# Patient Record
Sex: Female | Born: 1935 | Race: White | Hispanic: No | Smoking: Former smoker
Health system: Southern US, Community
[De-identification: ages and names within clinical notes are randomized; demographics above are authoritative.]

## PROBLEM LIST (undated history)

## (undated) DIAGNOSIS — I341 Nonrheumatic mitral (valve) prolapse: Secondary | ICD-10-CM

## (undated) DIAGNOSIS — R011 Cardiac murmur, unspecified: Secondary | ICD-10-CM

## (undated) DIAGNOSIS — O88819 Other embolism in pregnancy, unspecified trimester: Secondary | ICD-10-CM

## (undated) DIAGNOSIS — M1990 Unspecified osteoarthritis, unspecified site: Secondary | ICD-10-CM

## (undated) DIAGNOSIS — O88219 Thromboembolism in pregnancy, unspecified trimester: Secondary | ICD-10-CM

## (undated) DIAGNOSIS — I10 Essential (primary) hypertension: Secondary | ICD-10-CM

## (undated) DIAGNOSIS — E7800 Pure hypercholesterolemia, unspecified: Secondary | ICD-10-CM

## (undated) HISTORY — PX: CATARACT EXTRACTION: SUR2

## (undated) HISTORY — PX: BREAST SURGERY: SHX581

## (undated) HISTORY — PX: EYE SURGERY: SHX253

## (undated) HISTORY — PX: FOOT SURGERY: SHX648

---

## 1998-09-02 ENCOUNTER — Emergency Department (HOSPITAL_COMMUNITY): Admission: EM | Admit: 1998-09-02 | Discharge: 1998-09-02 | Payer: Self-pay | Admitting: Emergency Medicine

## 1998-09-02 ENCOUNTER — Encounter: Payer: Self-pay | Admitting: Emergency Medicine

## 2000-01-08 ENCOUNTER — Ambulatory Visit (HOSPITAL_COMMUNITY): Admission: RE | Admit: 2000-01-08 | Discharge: 2000-01-08 | Payer: Self-pay | Admitting: Internal Medicine

## 2000-03-12 ENCOUNTER — Encounter: Admission: RE | Admit: 2000-03-12 | Discharge: 2000-03-12 | Payer: Self-pay | Admitting: Endocrinology

## 2000-03-12 ENCOUNTER — Encounter: Payer: Self-pay | Admitting: Internal Medicine

## 2000-03-26 ENCOUNTER — Other Ambulatory Visit: Admission: RE | Admit: 2000-03-26 | Discharge: 2000-03-26 | Payer: Self-pay | Admitting: Internal Medicine

## 2001-05-26 ENCOUNTER — Encounter: Payer: Self-pay | Admitting: Internal Medicine

## 2001-05-26 ENCOUNTER — Encounter: Admission: RE | Admit: 2001-05-26 | Discharge: 2001-05-26 | Payer: Self-pay | Admitting: Internal Medicine

## 2001-07-01 ENCOUNTER — Other Ambulatory Visit: Admission: RE | Admit: 2001-07-01 | Discharge: 2001-07-01 | Payer: Self-pay | Admitting: Internal Medicine

## 2002-06-29 ENCOUNTER — Encounter: Payer: Self-pay | Admitting: Internal Medicine

## 2002-06-29 ENCOUNTER — Encounter: Admission: RE | Admit: 2002-06-29 | Discharge: 2002-06-29 | Payer: Self-pay | Admitting: Internal Medicine

## 2002-12-15 ENCOUNTER — Other Ambulatory Visit: Admission: RE | Admit: 2002-12-15 | Discharge: 2002-12-15 | Payer: Self-pay | Admitting: Internal Medicine

## 2005-10-29 ENCOUNTER — Ambulatory Visit (HOSPITAL_COMMUNITY): Admission: RE | Admit: 2005-10-29 | Discharge: 2005-10-29 | Payer: Self-pay | Admitting: *Deleted

## 2006-08-28 ENCOUNTER — Inpatient Hospital Stay (HOSPITAL_COMMUNITY): Admission: EM | Admit: 2006-08-28 | Discharge: 2006-08-29 | Payer: Self-pay | Admitting: Emergency Medicine

## 2010-03-22 ENCOUNTER — Encounter: Admission: RE | Admit: 2010-03-22 | Discharge: 2010-03-22 | Payer: Self-pay | Admitting: Internal Medicine

## 2010-05-25 ENCOUNTER — Emergency Department (HOSPITAL_BASED_OUTPATIENT_CLINIC_OR_DEPARTMENT_OTHER)
Admission: EM | Admit: 2010-05-25 | Discharge: 2010-05-25 | Payer: Self-pay | Source: Home / Self Care | Admitting: Emergency Medicine

## 2010-05-25 ENCOUNTER — Ambulatory Visit: Payer: Self-pay | Admitting: Interventional Radiology

## 2010-08-19 ENCOUNTER — Encounter
Admission: RE | Admit: 2010-08-19 | Discharge: 2010-08-19 | Payer: Self-pay | Source: Home / Self Care | Attending: Internal Medicine | Admitting: Internal Medicine

## 2010-10-10 ENCOUNTER — Emergency Department (HOSPITAL_BASED_OUTPATIENT_CLINIC_OR_DEPARTMENT_OTHER): Payer: Medicare Other

## 2010-10-10 ENCOUNTER — Emergency Department (HOSPITAL_BASED_OUTPATIENT_CLINIC_OR_DEPARTMENT_OTHER)
Admission: EM | Admit: 2010-10-10 | Discharge: 2010-10-10 | Disposition: A | Discharge status: Home / Self Care | Payer: Medicare Other | Attending: Emergency Medicine | Admitting: Emergency Medicine

## 2010-10-10 DIAGNOSIS — Z79899 Other long term (current) drug therapy: Secondary | ICD-10-CM | POA: Insufficient documentation

## 2010-10-10 DIAGNOSIS — S93609A Unspecified sprain of unspecified foot, initial encounter: Secondary | ICD-10-CM | POA: Insufficient documentation

## 2010-10-10 DIAGNOSIS — W1809XA Striking against other object with subsequent fall, initial encounter: Secondary | ICD-10-CM | POA: Insufficient documentation

## 2010-10-10 DIAGNOSIS — Y92009 Unspecified place in unspecified non-institutional (private) residence as the place of occurrence of the external cause: Secondary | ICD-10-CM | POA: Insufficient documentation

## 2010-10-10 DIAGNOSIS — I10 Essential (primary) hypertension: Secondary | ICD-10-CM | POA: Insufficient documentation

## 2010-10-10 DIAGNOSIS — M7989 Other specified soft tissue disorders: Secondary | ICD-10-CM

## 2010-10-10 DIAGNOSIS — Y93E1 Activity, personal bathing and showering: Secondary | ICD-10-CM

## 2010-10-10 DIAGNOSIS — W010XXA Fall on same level from slipping, tripping and stumbling without subsequent striking against object, initial encounter: Secondary | ICD-10-CM

## 2010-10-10 DIAGNOSIS — M79609 Pain in unspecified limb: Secondary | ICD-10-CM

## 2010-11-21 LAB — CBC
HCT: 35.9 % — ABNORMAL LOW (ref 36.0–46.0)
Hemoglobin: 12.6 g/dL (ref 12.0–15.0)
MCV: 90.4 fL (ref 78.0–100.0)
Platelets: 192 10*3/uL (ref 150–400)
RBC: 3.97 MIL/uL (ref 3.87–5.11)
WBC: 5.4 10*3/uL (ref 4.0–10.5)

## 2010-11-21 LAB — DIFFERENTIAL
Eosinophils Relative: 2 % (ref 0–5)
Lymphocytes Relative: 23 % (ref 12–46)
Lymphs Abs: 1.2 10*3/uL (ref 0.7–4.0)
Monocytes Absolute: 0.6 10*3/uL (ref 0.1–1.0)

## 2010-11-21 LAB — BASIC METABOLIC PANEL
BUN: 15 mg/dL (ref 6–23)
CO2: 23 mEq/L (ref 19–32)
Calcium: 9 mg/dL (ref 8.4–10.5)
Chloride: 108 mEq/L (ref 96–112)
Creatinine, Ser: 0.7 mg/dL (ref 0.4–1.2)
GFR calc Af Amer: >60 mL/min (ref >60)
GFR calc non Af Amer: >60 mL/min (ref >60)
Glucose, Bld: 102 mg/dL — ABNORMAL HIGH (ref 70–99)
Potassium: 4.2 mEq/L (ref 3.5–5.1)
Sodium: 140 mEq/L (ref 135–145)

## 2010-11-21 LAB — POCT CARDIAC MARKERS
CKMB, poc: <1 ng/mL — ABNORMAL LOW (ref 1.0–8.0)
Troponin i, poc: <0.05 ng/mL (ref 0.00–0.09)

## 2010-11-21 LAB — URINALYSIS, ROUTINE W REFLEX MICROSCOPIC
Glucose, UA: NEGATIVE mg/dL
Ketones, ur: NEGATIVE mg/dL
pH: 7.5 (ref 5.0–8.0)

## 2011-01-24 NOTE — Op Note (Signed)
Faith Ritter, WOLDT NO.:  [id number]   MEDICAL RECORD NO.:  [id number]          PATIENT TYPE:  AMB   LOCATION:  ENDO                         FACILITY:  [facility]   PHYSICIAN:  Georgiana Spinner, M.D.    DATE OF BIRTH:  1935/12/17   DATE OF PROCEDURE:  10/29/2005  DATE OF DISCHARGE:                                 OPERATIVE REPORT   PROCEDURE:  Upper endoscopy.   INDICATION:  Abdominal pain.   ANESTHESIA:  Demerol 30 Versed 5 mg.   DESCRIPTION OF PROCEDURE:  With the patient mildly sedated in the left  lateral decubitus position, the Olympus videoscopic endoscope was inserted  in the mouth and passed under direct vision through the esophagus which  appeared normal. There was no evidence of Barrett's. We entered into the  stomach, fundus, body, antrum, duodenal bulb, second portion of the duodenum  appeared normal.   From this point the endoscope was slowly withdrawn taking circumferential  views of duodenal mucosa until the endoscope then pulled back into the  stomach, placed in retroflexion to view the stomach from below. The  endoscope was straightened and withdrawn taking circumferential views of the  remaining gastric and esophageal mucosa. The patient's vital signs and pulse  oximeter remained stable. The patient tolerated the procedure well without  apparent complications.   FINDINGS:  Unremarkable examination.   PLAN:  Proceed to colonoscopy.           ______________________________  Georgiana Spinner, M.D.     GMO/MEDQ  D:  10/29/2005  T:  10/29/2005  Job:  [id number]

## 2011-01-24 NOTE — Op Note (Signed)
[name], [name] NO.:  [id number]   MEDICAL RECORD NO.:  [id number]          PATIENT TYPE:  AMB   LOCATION:  ENDO                         FACILITY:  [facility]   PHYSICIAN:  Georgiana Spinner, M.D.    DATE OF BIRTH:  1936-05-01   DATE OF PROCEDURE:  10/29/2005  DATE OF DISCHARGE:                                 OPERATIVE REPORT   PROCEDURE:  Colonoscopy   INDICATIONS:  Abdominal pain and colon cancer screening.   ANESTHESIA:  Demerol 20 Versed to 2.5 mg.   DESCRIPTION OF PROCEDURE:  With the patient mildly sedated in the left  lateral decubitus position, the Olympus videoscopic colonoscope was inserted  in the rectum and passed with pressure applied to the abdomen. Under direct  vision we reached the cecum identified by ileocecal valve and appendiceal  orifice both of which were photographed. From this point the colonoscope was  slowly withdrawn taking circumferential views of the colonic mucosa stopping  in the rectum which appeared normal on direct and showed hemorrhoids in  retroflex view.  The endoscope was straightened and withdrawn. The patient's  vital signs and pulse oximeter remained stable. The patient tolerated the  procedure well without apparent complications.   FINDINGS:  Internal hemorrhoids, otherwise unremarkable colonoscopic  examination to the cecum.   PLAN:  To have the patient follow up with me as an outpatient.           ______________________________  Georgiana Spinner, M.D.     GMO/MEDQ  D:  10/29/2005  T:  10/29/2005  Job:  [id number]

## 2011-01-24 NOTE — H&P (Signed)
NAMEJAELANI, POSA NO.:  [id number]   MEDICAL RECORD NO.:  [id number]          PATIENT TYPE:  EMS   LOCATION:  MAJO                         FACILITY:  [facility]   PHYSICIAN:  Elliot Cousin, M.D.    DATE OF BIRTH:  28-Aug-1936   DATE OF ADMISSION:  08/28/2006  DATE OF DISCHARGE:                              HISTORY & PHYSICAL   PRIMARY CARE PHYSICIAN:  Dr. Dimas Alexandria.   CHIEF COMPLAINT:  Chest pain.   HISTORY OF PRESENT ILLNESS:  The patient is a 75 year old woman with a  past medical history significant for hypertension and hyperlipidemia,  who presents to the emergency department with a 2-day history of  intermittent chest pain.  The chest pain is located in the substernal  area and occasionally in the epigastric abdominal region.  The pain is  described as a pressure.  At its worst, it is a 6-8/10 in intensity.  It occurs primarily at rest and only occasionally with activity.  On one  occasion earlier yesterday, the pain was associated with diaphoresis and  nausea.  She also had lightheadedness, but no vertigo.  She denies any  associated shortness of breath, pleurisy, cough, burping, indigestion  and fever and chills.  The pain occasionally radiates to the left arm,  primarily over the left shoulder; however, the patient states that she  has intermittent chronic left shoulder pain.  The patient usually  exercises twice weekly at a local gym.  Over the past few weeks, she has  had no pain during her workouts.  She also states that she had a stress  test performed a few years ago and it was negative.   The patient presented to the [facility] earlier today, prior to  presenting to the emergency department at [facility].  At that  time, her EKG revealed sinus bradycardia with a first-degree AV block  and a heart rate of 55 beats per minute.  When the patient presented to  the emergency department at [facility], she was noted to be mildly  hypertensive with a heart rate of 181/87.  She was given 4 baby aspirin,  but no sublingual nitroglycerin.  She is currently chest-pain-free,  although she says that the pain feels as if it is beginning to come  back.  The patient will therefore be admitted for further evaluation and  management.   PAST MEDICAL HISTORY:  1. History of a negative stress test in the past.  2. Hypertension.  3. Hyperlipidemia.  4. History of hospitalization in 1991 secondary to malnutrition      associated with depression.  5. Degenerative joint disease with chronic intermittent left shoulder      pain.   MEDICATIONS:  The patient is unclear on the doses of her medications;  however, she takes:  1. Atenolol one tablet daily.  2. Hyzaar one tablet daily.  3. TriCor one tablet daily.  4. Aspirin 81 mg daily.   ALLERGIES:  The patient has an allergy to SULFA DRUGS and PENICILLIN,  which causes swelling.   SOCIAL HISTORY:  The patient is divorced.  [place]  She has 5 children.  She is a retired [employer] trader.  She  denies tobacco use.  She denies illicit drug use.  She rarely drinks  alcohol.  She is fairly independent and goes to the gym twice weekly.   FAMILY HISTORY:  Her mother died of a stroke at 60 years of age.  Her  father died of a heart attack at 17 years of age.   REVIEW OF SYSTEMS:  The patient's review of systems is positive for  arthritic pain.   EXAM:  VITAL SIGNS:  Temperature 99.2, blood pressure 181/87, pulse 74,  respiratory rate 20, oxygen saturation 99% on room air.  GENERAL:  The patient is a pleasant 75 year old Caucasian woman who is  currently lying in bed in no acute distress.  HEENT:  Head is normocephalic and nontraumatic.  Pupils are equal, round  and reactive to light.  Extraocular movements are intact.  Conjunctivae  are clear.  Sclerae are white.  Tympanic membranes are clear  bilaterally.  Nasal mucosa is mildly dry, no sinus  tenderness.  Oropharynx reveals partial dentures.  Mucous membranes are minimally  dry.  No posterior exudates or erythema.  NECK:  Supple with no adenopathy, no thyromegaly, no bruit, no JVD.  LUNGS:  Clear to auscultation bilaterally.  HEART:  S1 and S2 with a soft systolic murmur.  ABDOMEN:  Positive bowel sounds.  Soft, nontender and non-distended.  No  hepatosplenomegaly.  No masses palpated.  EXTREMITIES:  Pedal pulses are 2+ bilaterally.  No pretibial edema and  no pedal edema.  Mild arthritic changes seen in her knees and her hands.  The patient has a good range of motion of her upper and lower  extremities.  NEUROLOGIC:  The patient is alert and oriented x3.  Cranial nerves II-  XII are intact.  Strength is 5/5 throughout.  Sensation is intact.   ADMISSION LABORATORY DATA:  EKG reveals normal sinus rhythm with a heart  rate of 61 beats per minute and a first-degree AV block.   Sodium 137, potassium 3.8, chloride 105, BUN 18, glucose 118,  bicarbonate 28, creatinine 1.0.  Myoglobin 75.3, CK-MB 1.6, troponin I  less than 0.05.  WBC 7.0, hemoglobin 13, hematocrit 38.2, platelets  230,000.  Total protein 6.6, albumin 3.8, AST 24, ALT 22, alkaline  phosphatase 57, total bilirubin 0.4, lipase 44.   RADIOLOGIC FINDINGS:  Chest x-ray reveals no acute disease.   ASSESSMENT:  1. Substernal chest pain.  The patient's presentation is somewhat      atypical for angina, although 2 components, namely diaphoresis and      nausea, are not.  The patient's pain occurs primarily with rest and      not with activity.  However, the patient does have significant risk      factors for coronary artery disease including positive family      history, hypertension, and hyperlipidemia.  2. Hypertension.  The patient is chronically treated with atenolol and      Hyzaar.  3. Hyperlipidemia.  The patient is chronically treated with TriCor.   PLAN: 1. The patient will be admitted for further evaluation  and management.  2. We will start full-dose Lovenox, nitroglycerin paste, aspirin,      oxygen therapy and p.r.n. morphine.  We will continue atenolol and      Hyzaar.  3. We will check cardiac enzymes q.8 h. x3.  We will also assess the  patient's TSH, fasting lipid panel and homocysteine level.  4. Consider inpatient versus outpatient stress test.  It may be      reasonable to defer stress testing to the outpatient setting, if      the patient's cardiac enzymes are completely negative.  5. We will repeat an EKG in the morning and p.r.n. for chest pain.      Elliot Cousin, M.D.  Electronically Signed     DF/MEDQ  D:  08/28/2006  T:  08/28/2006  Job:  [id number]   cc:   Janae Bridgeman. Eloise Harman., M.D.

## 2011-01-24 NOTE — Discharge Summary (Signed)
Faith Ritter, WIRKKALA NO.:  [id number]   MEDICAL RECORD NO.:  [id number]          PATIENT TYPE:  INP   LOCATION:  4733                         FACILITY:  [facility]   PHYSICIAN:  Madaline Savage, MD        DATE OF BIRTH:  Oct 21, 1935   DATE OF ADMISSION:  08/27/2006  DATE OF DISCHARGE:                               DISCHARGE SUMMARY   PRIMARY CARE PHYSICIAN:  Janae Bridgeman. Lendell Caprice, M.D.   DISCHARGE DIAGNOSES:  1. Atypical chest pain, MI ruled out.  2. Hypertension.  3. Hyperlipidemia.   PROCEDURES DONE IN THE HOSPITAL:  She had an x-ray of the chest done on  August 27, 2006 which showed cardiomegaly without any acute disease.   HISTORY OF PRESENT ILLNESS:  Mrs. Placzek is a 75 year old woman with a  history of hypertension and hyperlipidemia who came to the ER with a two  day history of intermittent chest pain.  Chest pain was substernal.  She  also had some shoulder problems and has had shoulder pain for awhile.  She does not think the chest pain made the shoulder pain any worse.  She  has had multiple episodes of this pain and it has no relation to  exertion.  Her pain also did not improve with the nitroglycerin she was  given in the ER.  She was admitted to the hospital for further  evaluation of chest pain.   PROBLEM LIST:  1. Atypical chest pain.  Her chest pain had more atypical features,      but she did have risk factors for hypertension and hyperlipidemia.      We completed her cardiac enzymes while in the hospital, which were      all negative.  Her troponins were 0.02, 0.02 and 0.01.  Her EKG did      not show any ST or T-wave changes.  She has been chest pain free      since she came to the hospital.  Considering that she has two risk      factors, she will need a stress test to rule out coronary artery      disease.  I offered to have this done as an inpatient versus      getting it done as an outpatient by her primary care physician.      She would  prefer to get it done as an outpatient and so she is      being discharged home in stable condition.  On discharge, she will      be given sublingual nitroglycerin prescriptions, which she will      take if she develops any chest pain x3 three minutes apart.  If she      still continues to have pain after those three doses of sublingual      nitroglycerin, she is advised to call 911 and come to the ER.  She      will also continue on the aspirin and beta blocker, which she was      on at home.  2. Hypertension.  Her blood pressure  was well controlled on this      hospital stay on her home dose of medications.  3. Hyperlipidemia.  We did get the cholesterol panel while she was in      the hospital, which showed triglycerides of 122, total cholesterol      of 161, HDL of 44 and LDL of 93.  She will continue on the Tricor      she was on at home.   DISCHARGE MEDICATIONS:  1. Nitroglycerin 0.4 mg sublingual for chest pain every three minutes      x3.  2. Atenolol 25 mg once daily.  3. Hizaar 100 per 25 once daily.  4. Aspirin 81 mg once daily.  5. Tricor 40 mg once daily.   DISPOSITION:  She is now being discharged home in stable condition.   FOLLOW UP:  She has been advised to call Dr. Pincus Sanes office early  next week to arrange for a stress test as an outpatient within a week.      Madaline Savage, MD  Electronically Signed     PKN/MEDQ  D:  08/29/2006  T:  08/30/2006  Job:  [id number]   cc:   Janae Bridgeman. Eloise Harman., M.D.

## 2011-05-25 ENCOUNTER — Encounter: Payer: Self-pay | Admitting: Emergency Medicine

## 2011-05-25 ENCOUNTER — Emergency Department (INDEPENDENT_AMBULATORY_CARE_PROVIDER_SITE_OTHER): Payer: Medicare Other

## 2011-05-25 ENCOUNTER — Emergency Department (HOSPITAL_BASED_OUTPATIENT_CLINIC_OR_DEPARTMENT_OTHER)
Admission: EM | Admit: 2011-05-25 | Discharge: 2011-05-25 | Disposition: A | Discharge status: Home / Self Care | Payer: Medicare Other | Attending: Emergency Medicine | Admitting: Emergency Medicine

## 2011-05-25 ENCOUNTER — Other Ambulatory Visit: Payer: Self-pay

## 2011-05-25 DIAGNOSIS — R079 Chest pain, unspecified: Secondary | ICD-10-CM

## 2011-05-25 DIAGNOSIS — I10 Essential (primary) hypertension: Secondary | ICD-10-CM | POA: Insufficient documentation

## 2011-05-25 DIAGNOSIS — E7800 Pure hypercholesterolemia, unspecified: Secondary | ICD-10-CM | POA: Insufficient documentation

## 2011-05-25 DIAGNOSIS — R0602 Shortness of breath: Secondary | ICD-10-CM

## 2011-05-25 HISTORY — DX: Pure hypercholesterolemia, unspecified: E78.00

## 2011-05-25 HISTORY — DX: Essential (primary) hypertension: I10

## 2011-05-25 LAB — BASIC METABOLIC PANEL
GFR calc Af Amer: >60 mL/min (ref >60)
GFR calc non Af Amer: >60 mL/min (ref >60)
Glucose, Bld: 125 mg/dL — ABNORMAL HIGH (ref 70–99)
Potassium: 4.2 mEq/L (ref 3.5–5.1)
Sodium: 138 mEq/L (ref 135–145)

## 2011-05-25 LAB — DIFFERENTIAL
Lymphocytes Relative: 22 % (ref 12–46)
Lymphs Abs: 1.3 10*3/uL (ref 0.7–4.0)
Monocytes Relative: 12 % (ref 3–12)
Neutro Abs: 3.9 10*3/uL (ref 1.7–7.7)
Neutrophils Relative %: 66 % (ref 43–77)

## 2011-05-25 LAB — CARDIAC PANEL(CRET KIN+CKTOT+MB+TROPI)
CK, MB: 1.8 ng/mL (ref 0.3–4.0)
CK, MB: 1.7 ng/mL (ref 0.3–4.0)
Total CK: 50 U/L (ref 7–177)
Troponin I: <0.3 ng/mL (ref <0.30)

## 2011-05-25 LAB — HEPATIC FUNCTION PANEL
ALT: 15 U/L (ref 0–35)
AST: 20 U/L (ref 0–37)
Albumin: 4 g/dL (ref 3.5–5.2)
Alkaline Phosphatase: 54 U/L (ref 39–117)
Total Bilirubin: 0.4 mg/dL (ref 0.3–1.2)

## 2011-05-25 LAB — CBC
Hemoglobin: 12.5 g/dL (ref 12.0–15.0)
MCHC: 34.5 g/dL (ref 30.0–36.0)
Platelets: 229 10*3/uL (ref 150–400)
RDW: 14.2 % (ref 11.5–15.5)

## 2011-05-25 MED ORDER — IOHEXOL 350 MG/ML SOLN
100.0000 mL | Freq: Once | INTRAVENOUS | Status: AC | PRN
  Administered 2011-05-25: 100 mL via INTRAVENOUS

## 2011-05-25 MED ORDER — NITROGLYCERIN 0.4 MG SL SUBL
0.4000 mg | SUBLINGUAL_TABLET | SUBLINGUAL | Status: DC | PRN
  Filled 2011-05-25: qty 25

## 2011-05-25 MED ORDER — NITROGLYCERIN 0.4 MG SL SUBL
0.4000 mg | SUBLINGUAL_TABLET | SUBLINGUAL | Status: DC | PRN
  Administered 2011-05-25 (×3): 0.4 mg via SUBLINGUAL
  Filled 2011-05-25: qty 25

## 2011-05-25 NOTE — ED Provider Notes (Signed)
History     CSN: [id number] Arrival date & time: 05/25/2011 10:07 AM   Chief Complaint  Patient presents with  . Chest Pain     (Include location/radiation/quality/duration/timing/severity/associated sxs/prior treatment) HPI Patient had some anterior chest pain that began this morning when she awoke at 7:30. The pain has since radiated under the left arm and has essentially moved to the left lateral area. She has had some several similar pain in the past. She reports that she was seen by Dr. Newt Lukes and had a negative stress test in the past 1-1/2 years. She took baby aspirin prior to arrival. Denies shortness of breath, diaphoresis, nausea, or vomiting.  Past Medical History  Diagnosis Date  . Hypertension   . Hypercholesteremia    reviewed that surgery was within the past several weeks.   Past Surgical History  Procedure Date  . Foot surgery     History reviewed. No pertinent family history.  History  Substance Use Topics  . Smoking status: Not on file  . Smokeless tobacco: Not on file  . Alcohol Use: Yes     occasionally    OB History    Grav Para Term Preterm Abortions TAB SAB Ect Mult Living                  Review of Systems  All other systems reviewed and are negative.    Allergies  Penicillins  Home Medications   Current Outpatient Rx  Name Route Sig Dispense Refill  . CARVEDILOL 6.25 MG PO TABS Oral Take 6.25 mg by mouth 2 (two) times daily with a meal.      . FENOFIBRATE 48 MG PO TABS Oral Take 48 mg by mouth daily.      . IBUPROFEN 200 MG PO TABS Oral Take 400 mg by mouth every 6 (six) hours as needed.      . ISOSORB DINITRATE-HYDRALAZINE 20-37.5 MG PO TABS Oral Take 1 tablet by mouth 3 (three) times daily.      Marland Kitchen LOSARTAN POTASSIUM-HCTZ 100-25 MG PO TABS Oral Take 1 tablet by mouth daily.        Physical Exam    BP 153/63  Pulse 55  Temp(Src) 97.6 F (36.4 C) (Oral)  Resp 11  Ht 5' (1.524 m)  Wt 152 lb (68.947 kg)  BMI 29.69 kg/m2   SpO2 100%  Physical Exam  Vitals reviewed.  physical exam vital signs are reviewed and this patient is slightly bradycardic with a heart rate in the upper 50s This is a well-developed well-nourished female sitting in bed does not appear to be any acute distress HEENT normocephalic atraumatic oropharynx is clear and moist Eyes pupils uterine round reactive to light conjunctivae are normal extraocular movements are normal Neck range of motion is normal neck is supple Cardiovascular patient's heart rate is normal on auscultation with a regular rate and rhythm heart sounds are normal distal pulses are intact The pulmonary/chest wall there is some tenderness to palpation over the mid sternum. Lungs are clear to auscultation Abdomen is soft nontender bowel sounds are normal Extremities show normal range of movement pulses intact throughout no rashes are noted Neurologically the patient is alert and oriented x3 strength is equal throughout Skin is warm and dry Psych patient's mood and affect are normal  ED Course  Procedures  Results for orders placed during the hospital encounter of 05/25/11  CBC      Component Value Range   WBC 6.2  4.0 - 10.5 (K/uL)  RBC 4.07  3.87 - 5.11 (MIL/uL)   Hemoglobin 12.5  12.0 - 15.0 (g/dL)   HCT 40.9  81.1 - 91.4 (%)   MCV 88.9  78.0 - 100.0 (fL)   MCH 30.7  26.0 - 34.0 (pg)   MCHC 34.5  30.0 - 36.0 (g/dL)   RDW 78.2  95.6 - 21.3 (%)   Platelets 229  150 - 400 (K/uL)   Chest Portable 1 View  05/25/2011  *RADIOLOGY REPORT*  Clinical Data: Chest pain  PORTABLE CHEST - 1 VIEW  Comparison: 05/25/2010  Findings: Chronic interstitial markings.  No pleural effusion or pneumothorax.  The heart is top normal in size.  Right upper rib deformities.  IMPRESSION: No evidence of acute cardiopulmonary disease.  Chronic interstitial markings.  Original Report Authenticated By: Charline Bills, M.D.     No diagnosis found.   MDM  Date: 05/25/2011  Rate: 56   Rhythm: sinus bradycardia  QRS Axis: normal  Intervals: normal  ST/T Wave abnormalities: normal  Conduction Disutrbances:first-degree A-V block   Narrative Interpretation:   Old EKG Reviewed: changes noted and with rate decreased from 60 to 56 ED course patient has had minimal chest discomfort here in the emergency department this appears to worsen with patient and movement. EKG does not show any acute ischemic changes. I do not have access to her previous cardiac catheterization. I spoke with Dr. Jacinto Halim. Second set of cardiac markers are being rechecked and these are normal she will be discharged home with instructions to return if worse. Dr. Verl Dicker office will call her in the morning for followup.   Hilario Quarry, MD 05/25/11 [phone]

## 2011-05-25 NOTE — ED Notes (Addendum)
Pt states she started having chest pain this am after she awoke today, sitting on sofa drinking coffee.  No SOB.  Some lightheadedness.  No N/V or diaphoresis.  Pain starts in center of chest radiates around to side under left arm.  Pt took 6 baby aspirins prior to arrival.

## 2011-11-13 DIAGNOSIS — L570 Actinic keratosis: Secondary | ICD-10-CM | POA: Diagnosis not present

## 2011-11-24 DIAGNOSIS — C4360 Malignant melanoma of unspecified upper limb, including shoulder: Secondary | ICD-10-CM | POA: Diagnosis not present

## 2011-11-24 DIAGNOSIS — L570 Actinic keratosis: Secondary | ICD-10-CM | POA: Diagnosis not present

## 2011-11-24 DIAGNOSIS — L820 Inflamed seborrheic keratosis: Secondary | ICD-10-CM | POA: Diagnosis not present

## 2011-12-09 DIAGNOSIS — Z1212 Encounter for screening for malignant neoplasm of rectum: Secondary | ICD-10-CM | POA: Diagnosis not present

## 2011-12-09 DIAGNOSIS — E7800 Pure hypercholesterolemia, unspecified: Secondary | ICD-10-CM | POA: Diagnosis not present

## 2011-12-09 DIAGNOSIS — I10 Essential (primary) hypertension: Secondary | ICD-10-CM | POA: Diagnosis not present

## 2011-12-09 DIAGNOSIS — Z01419 Encounter for gynecological examination (general) (routine) without abnormal findings: Secondary | ICD-10-CM | POA: Diagnosis not present

## 2011-12-09 DIAGNOSIS — Z0000 Encounter for general adult medical examination without abnormal findings: Secondary | ICD-10-CM | POA: Diagnosis not present

## 2011-12-30 DIAGNOSIS — C4360 Malignant melanoma of unspecified upper limb, including shoulder: Secondary | ICD-10-CM | POA: Diagnosis not present

## 2011-12-30 DIAGNOSIS — C4359 Malignant melanoma of other part of trunk: Secondary | ICD-10-CM | POA: Diagnosis not present

## 2012-01-02 DIAGNOSIS — H2510 Age-related nuclear cataract, unspecified eye: Secondary | ICD-10-CM | POA: Diagnosis not present

## 2012-01-13 DIAGNOSIS — H2510 Age-related nuclear cataract, unspecified eye: Secondary | ICD-10-CM | POA: Diagnosis not present

## 2012-01-13 DIAGNOSIS — D485 Neoplasm of uncertain behavior of skin: Secondary | ICD-10-CM | POA: Diagnosis not present

## 2012-01-19 DIAGNOSIS — IMO0002 Reserved for concepts with insufficient information to code with codable children: Secondary | ICD-10-CM | POA: Diagnosis not present

## 2012-01-19 DIAGNOSIS — H2510 Age-related nuclear cataract, unspecified eye: Secondary | ICD-10-CM | POA: Diagnosis not present

## 2012-01-19 DIAGNOSIS — H269 Unspecified cataract: Secondary | ICD-10-CM | POA: Diagnosis not present

## 2012-01-27 DIAGNOSIS — H2510 Age-related nuclear cataract, unspecified eye: Secondary | ICD-10-CM | POA: Diagnosis not present

## 2012-02-09 DIAGNOSIS — H2510 Age-related nuclear cataract, unspecified eye: Secondary | ICD-10-CM | POA: Diagnosis not present

## 2012-02-09 DIAGNOSIS — IMO0002 Reserved for concepts with insufficient information to code with codable children: Secondary | ICD-10-CM | POA: Diagnosis not present

## 2012-02-09 DIAGNOSIS — H269 Unspecified cataract: Secondary | ICD-10-CM | POA: Diagnosis not present

## 2012-03-15 DIAGNOSIS — I10 Essential (primary) hypertension: Secondary | ICD-10-CM | POA: Diagnosis not present

## 2012-03-15 DIAGNOSIS — E7800 Pure hypercholesterolemia, unspecified: Secondary | ICD-10-CM | POA: Diagnosis not present

## 2012-03-18 DIAGNOSIS — E7800 Pure hypercholesterolemia, unspecified: Secondary | ICD-10-CM | POA: Diagnosis not present

## 2012-03-18 DIAGNOSIS — Z0000 Encounter for general adult medical examination without abnormal findings: Secondary | ICD-10-CM | POA: Diagnosis not present

## 2012-03-18 DIAGNOSIS — I10 Essential (primary) hypertension: Secondary | ICD-10-CM | POA: Diagnosis not present

## 2012-04-13 DIAGNOSIS — Z85820 Personal history of malignant melanoma of skin: Secondary | ICD-10-CM | POA: Diagnosis not present

## 2012-04-13 DIAGNOSIS — L719 Rosacea, unspecified: Secondary | ICD-10-CM | POA: Diagnosis not present

## 2012-04-13 DIAGNOSIS — D485 Neoplasm of uncertain behavior of skin: Secondary | ICD-10-CM | POA: Diagnosis not present

## 2012-04-13 DIAGNOSIS — D235 Other benign neoplasm of skin of trunk: Secondary | ICD-10-CM | POA: Diagnosis not present

## 2012-05-24 DIAGNOSIS — E782 Mixed hyperlipidemia: Secondary | ICD-10-CM | POA: Diagnosis not present

## 2012-05-24 DIAGNOSIS — I10 Essential (primary) hypertension: Secondary | ICD-10-CM | POA: Diagnosis not present

## 2012-05-24 DIAGNOSIS — R0602 Shortness of breath: Secondary | ICD-10-CM | POA: Diagnosis not present

## 2012-06-26 DIAGNOSIS — R3989 Other symptoms and signs involving the genitourinary system: Secondary | ICD-10-CM | POA: Diagnosis not present

## 2012-06-28 DIAGNOSIS — N390 Urinary tract infection, site not specified: Secondary | ICD-10-CM | POA: Diagnosis not present

## 2012-06-30 ENCOUNTER — Other Ambulatory Visit: Payer: Self-pay | Admitting: Internal Medicine

## 2012-06-30 DIAGNOSIS — N390 Urinary tract infection, site not specified: Secondary | ICD-10-CM | POA: Diagnosis not present

## 2012-06-30 DIAGNOSIS — R109 Unspecified abdominal pain: Secondary | ICD-10-CM | POA: Diagnosis not present

## 2012-06-30 DIAGNOSIS — I10 Essential (primary) hypertension: Secondary | ICD-10-CM | POA: Diagnosis not present

## 2012-06-30 DIAGNOSIS — Z79899 Other long term (current) drug therapy: Secondary | ICD-10-CM | POA: Diagnosis not present

## 2012-06-30 DIAGNOSIS — G589 Mononeuropathy, unspecified: Secondary | ICD-10-CM | POA: Diagnosis not present

## 2012-06-30 DIAGNOSIS — E538 Deficiency of other specified B group vitamins: Secondary | ICD-10-CM | POA: Diagnosis not present

## 2012-07-05 ENCOUNTER — Ambulatory Visit
Admission: RE | Admit: 2012-07-05 | Discharge: 2012-07-05 | Disposition: A | Discharge status: Home / Self Care | Payer: Medicare Other | Source: Ambulatory Visit | Attending: Internal Medicine | Admitting: Internal Medicine

## 2012-07-05 DIAGNOSIS — R109 Unspecified abdominal pain: Secondary | ICD-10-CM | POA: Diagnosis not present

## 2012-07-05 MED ORDER — IOHEXOL 300 MG/ML  SOLN
100.0000 mL | Freq: Once | INTRAMUSCULAR | Status: AC | PRN
  Administered 2012-07-05: 100 mL via INTRAVENOUS

## 2012-07-07 DIAGNOSIS — R109 Unspecified abdominal pain: Secondary | ICD-10-CM | POA: Diagnosis not present

## 2012-07-07 DIAGNOSIS — N390 Urinary tract infection, site not specified: Secondary | ICD-10-CM | POA: Diagnosis not present

## 2012-07-20 DIAGNOSIS — I10 Essential (primary) hypertension: Secondary | ICD-10-CM | POA: Diagnosis not present

## 2012-07-20 DIAGNOSIS — R209 Unspecified disturbances of skin sensation: Secondary | ICD-10-CM | POA: Diagnosis not present

## 2012-08-02 DIAGNOSIS — J329 Chronic sinusitis, unspecified: Secondary | ICD-10-CM | POA: Diagnosis not present

## 2012-09-15 DIAGNOSIS — I10 Essential (primary) hypertension: Secondary | ICD-10-CM | POA: Diagnosis not present

## 2012-09-21 DIAGNOSIS — E7800 Pure hypercholesterolemia, unspecified: Secondary | ICD-10-CM | POA: Diagnosis not present

## 2012-09-21 DIAGNOSIS — I10 Essential (primary) hypertension: Secondary | ICD-10-CM | POA: Diagnosis not present

## 2012-09-21 DIAGNOSIS — R7309 Other abnormal glucose: Secondary | ICD-10-CM | POA: Diagnosis not present

## 2012-09-21 DIAGNOSIS — J0110 Acute frontal sinusitis, unspecified: Secondary | ICD-10-CM | POA: Diagnosis not present

## 2012-09-23 DIAGNOSIS — H26499 Other secondary cataract, unspecified eye: Secondary | ICD-10-CM | POA: Diagnosis not present

## 2012-10-11 DIAGNOSIS — Z1212 Encounter for screening for malignant neoplasm of rectum: Secondary | ICD-10-CM | POA: Diagnosis not present

## 2012-10-11 DIAGNOSIS — B3749 Other urogenital candidiasis: Secondary | ICD-10-CM | POA: Diagnosis not present

## 2012-10-11 DIAGNOSIS — Z01419 Encounter for gynecological examination (general) (routine) without abnormal findings: Secondary | ICD-10-CM | POA: Diagnosis not present

## 2012-10-11 DIAGNOSIS — N390 Urinary tract infection, site not specified: Secondary | ICD-10-CM | POA: Diagnosis not present

## 2012-10-12 DIAGNOSIS — D235 Other benign neoplasm of skin of trunk: Secondary | ICD-10-CM | POA: Diagnosis not present

## 2012-10-12 DIAGNOSIS — Z85820 Personal history of malignant melanoma of skin: Secondary | ICD-10-CM | POA: Diagnosis not present

## 2012-11-18 DIAGNOSIS — N899 Noninflammatory disorder of vagina, unspecified: Secondary | ICD-10-CM | POA: Diagnosis not present

## 2012-11-18 DIAGNOSIS — N390 Urinary tract infection, site not specified: Secondary | ICD-10-CM | POA: Diagnosis not present

## 2012-12-15 DIAGNOSIS — I10 Essential (primary) hypertension: Secondary | ICD-10-CM | POA: Diagnosis not present

## 2012-12-15 DIAGNOSIS — E7800 Pure hypercholesterolemia, unspecified: Secondary | ICD-10-CM | POA: Diagnosis not present

## 2012-12-15 DIAGNOSIS — R0789 Other chest pain: Secondary | ICD-10-CM | POA: Diagnosis not present

## 2012-12-16 DIAGNOSIS — R079 Chest pain, unspecified: Secondary | ICD-10-CM | POA: Diagnosis not present

## 2012-12-16 DIAGNOSIS — E782 Mixed hyperlipidemia: Secondary | ICD-10-CM | POA: Diagnosis not present

## 2013-01-12 DIAGNOSIS — Z1231 Encounter for screening mammogram for malignant neoplasm of breast: Secondary | ICD-10-CM | POA: Diagnosis not present

## 2013-01-19 DIAGNOSIS — R7309 Other abnormal glucose: Secondary | ICD-10-CM | POA: Diagnosis not present

## 2013-02-14 DIAGNOSIS — J329 Chronic sinusitis, unspecified: Secondary | ICD-10-CM | POA: Diagnosis not present

## 2013-03-01 DIAGNOSIS — J019 Acute sinusitis, unspecified: Secondary | ICD-10-CM | POA: Diagnosis not present

## 2013-04-12 DIAGNOSIS — L819 Disorder of pigmentation, unspecified: Secondary | ICD-10-CM | POA: Diagnosis not present

## 2013-04-12 DIAGNOSIS — D1801 Hemangioma of skin and subcutaneous tissue: Secondary | ICD-10-CM | POA: Diagnosis not present

## 2013-04-12 DIAGNOSIS — Z85820 Personal history of malignant melanoma of skin: Secondary | ICD-10-CM | POA: Diagnosis not present

## 2013-05-01 DIAGNOSIS — R350 Frequency of micturition: Secondary | ICD-10-CM | POA: Diagnosis not present

## 2013-05-01 DIAGNOSIS — N390 Urinary tract infection, site not specified: Secondary | ICD-10-CM | POA: Diagnosis not present

## 2013-05-18 DIAGNOSIS — R7309 Other abnormal glucose: Secondary | ICD-10-CM | POA: Diagnosis not present

## 2013-05-18 DIAGNOSIS — Z23 Encounter for immunization: Secondary | ICD-10-CM | POA: Diagnosis not present

## 2013-05-18 DIAGNOSIS — N390 Urinary tract infection, site not specified: Secondary | ICD-10-CM | POA: Diagnosis not present

## 2013-05-18 DIAGNOSIS — I10 Essential (primary) hypertension: Secondary | ICD-10-CM | POA: Diagnosis not present

## 2013-05-18 DIAGNOSIS — E7800 Pure hypercholesterolemia, unspecified: Secondary | ICD-10-CM | POA: Diagnosis not present

## 2013-07-07 DIAGNOSIS — B370 Candidal stomatitis: Secondary | ICD-10-CM | POA: Diagnosis not present

## 2013-07-07 DIAGNOSIS — R4182 Altered mental status, unspecified: Secondary | ICD-10-CM | POA: Diagnosis not present

## 2013-07-07 DIAGNOSIS — Z8639 Personal history of other endocrine, nutritional and metabolic disease: Secondary | ICD-10-CM | POA: Diagnosis not present

## 2013-07-07 DIAGNOSIS — N390 Urinary tract infection, site not specified: Secondary | ICD-10-CM | POA: Diagnosis not present

## 2013-07-07 DIAGNOSIS — R350 Frequency of micturition: Secondary | ICD-10-CM | POA: Diagnosis not present

## 2013-07-13 DIAGNOSIS — B3781 Candidal esophagitis: Secondary | ICD-10-CM | POA: Diagnosis not present

## 2013-07-13 DIAGNOSIS — R748 Abnormal levels of other serum enzymes: Secondary | ICD-10-CM | POA: Diagnosis not present

## 2013-07-13 DIAGNOSIS — E162 Hypoglycemia, unspecified: Secondary | ICD-10-CM | POA: Diagnosis not present

## 2013-07-20 ENCOUNTER — Encounter (HOSPITAL_BASED_OUTPATIENT_CLINIC_OR_DEPARTMENT_OTHER): Payer: Self-pay | Admitting: Emergency Medicine

## 2013-07-20 ENCOUNTER — Emergency Department (HOSPITAL_BASED_OUTPATIENT_CLINIC_OR_DEPARTMENT_OTHER)
Admission: EM | Admit: 2013-07-20 | Discharge: 2013-07-20 | Disposition: A | Discharge status: Home / Self Care | Payer: Medicare Other | Attending: Emergency Medicine | Admitting: Emergency Medicine

## 2013-07-20 DIAGNOSIS — R1310 Dysphagia, unspecified: Secondary | ICD-10-CM | POA: Insufficient documentation

## 2013-07-20 DIAGNOSIS — Z880 Allergy status to penicillin: Secondary | ICD-10-CM | POA: Insufficient documentation

## 2013-07-20 DIAGNOSIS — E7800 Pure hypercholesterolemia, unspecified: Secondary | ICD-10-CM | POA: Diagnosis not present

## 2013-07-20 DIAGNOSIS — R630 Anorexia: Secondary | ICD-10-CM | POA: Insufficient documentation

## 2013-07-20 DIAGNOSIS — Z79899 Other long term (current) drug therapy: Secondary | ICD-10-CM | POA: Diagnosis not present

## 2013-07-20 DIAGNOSIS — I10 Essential (primary) hypertension: Secondary | ICD-10-CM | POA: Insufficient documentation

## 2013-07-20 DIAGNOSIS — R531 Weakness: Secondary | ICD-10-CM

## 2013-07-20 DIAGNOSIS — Z7982 Long term (current) use of aspirin: Secondary | ICD-10-CM | POA: Insufficient documentation

## 2013-07-20 DIAGNOSIS — R51 Headache: Secondary | ICD-10-CM | POA: Diagnosis not present

## 2013-07-20 DIAGNOSIS — R5381 Other malaise: Secondary | ICD-10-CM | POA: Diagnosis not present

## 2013-07-20 DIAGNOSIS — R55 Syncope and collapse: Secondary | ICD-10-CM | POA: Insufficient documentation

## 2013-07-20 DIAGNOSIS — Z792 Long term (current) use of antibiotics: Secondary | ICD-10-CM | POA: Insufficient documentation

## 2013-07-20 DIAGNOSIS — Z87891 Personal history of nicotine dependence: Secondary | ICD-10-CM | POA: Diagnosis not present

## 2013-07-20 LAB — COMPREHENSIVE METABOLIC PANEL
ALT: 13 U/L (ref 0–35)
Alkaline Phosphatase: 53 U/L (ref 39–117)
CO2: 25 mEq/L (ref 19–32)
Chloride: 91 mEq/L — ABNORMAL LOW (ref 96–112)
GFR calc Af Amer: 71 mL/min — ABNORMAL LOW (ref >90)
Glucose, Bld: 102 mg/dL — ABNORMAL HIGH (ref 70–99)
Potassium: 3.6 mEq/L (ref 3.5–5.1)
Sodium: 129 mEq/L — ABNORMAL LOW (ref 135–145)
Total Protein: 7.2 g/dL (ref 6.0–8.3)

## 2013-07-20 LAB — CBC WITH DIFFERENTIAL/PLATELET
Basophils Absolute: 0 10*3/uL (ref 0.0–0.1)
Basophils Relative: 0 % (ref 0–1)
Eosinophils Relative: 0 % (ref 0–5)
HCT: 35.3 % — ABNORMAL LOW (ref 36.0–46.0)
MCHC: 34 g/dL (ref 30.0–36.0)
MCV: 91.2 fL (ref 78.0–100.0)
Monocytes Absolute: 1.1 10*3/uL — ABNORMAL HIGH (ref 0.1–1.0)
Neutro Abs: 7.8 10*3/uL — ABNORMAL HIGH (ref 1.7–7.7)
RDW: 14.4 % (ref 11.5–15.5)

## 2013-07-20 MED ORDER — SODIUM CHLORIDE 0.9 % IV BOLUS (SEPSIS)
1000.0000 mL | Freq: Once | INTRAVENOUS | Status: AC
  Administered 2013-07-20: 1000 mL via INTRAVENOUS

## 2013-07-20 NOTE — ED Provider Notes (Signed)
I saw and evaluated the patient, reviewed the resident's note and I agree with the findings and plan.  EKG Interpretation     Ventricular Rate:  55 PR Interval:  212 QRS Duration: 80 QT Interval:  404 QTC Calculation: 386 R Axis:   63 Text Interpretation:  Sinus bradycardia with 1st degree A-V block Otherwise normal ECG No significant change since last tracing            Patient's presentation is most c/w dehydration as the cause of her near syncope and weakness. She was able to ambulate here and has no focal neuro signs. I feel TIA, ACS, or other more serious pathology is unlikely in this scenario. She felt better throughout her ED stay. She was notified of her mild hyponatremia and will followup with her PCP and increase her by mouth intake.  Audree Camel, MD 07/20/13 [phone]

## 2013-07-20 NOTE — ED Notes (Signed)
Pt reports she was kneeling approximately 5 minutes in chapel, felt hot, became diaphoretic, and states she couldn't get up.  She describes the episode of "just feeling strange".  States she feels better now, however, "just not right".  States she had a dental extraction yesterday and has been taking PO ABX and Advil, but no other new medications.

## 2013-07-20 NOTE — ED Notes (Signed)
Pt reports having a toothache and "not eating much since yesterday" states "I was at the chapel ans suddenly I felt very weak and couldn't stand up or feel right". "I called my doctor and he told me to come"

## 2013-07-20 NOTE — ED Notes (Signed)
MD at bedside. 

## 2013-07-20 NOTE — ED Notes (Signed)
Pt states her hands were clammy. She had scrambled eggs after episode.

## 2013-07-20 NOTE — ED Provider Notes (Signed)
CSN: [id number]     Arrival date & time 07/20/13  1148 History   First MD Initiated Contact with Patient 07/20/13 1209     Chief Complaint  Patient presents with  . Near Syncope  . Weakness   (Consider location/radiation/quality/duration/timing/severity/associated sxs/prior Treatment) Patient is a 77 y.o. female presenting with weakness. The history is provided by the patient.  Weakness This is a new problem. The current episode started today. The problem occurs constantly. The problem has been gradually improving. Associated symptoms include anorexia, fatigue, headaches and weakness. Pertinent negatives include no abdominal pain, chest pain, chills, congestion, coughing, diaphoresis, fever, joint swelling, myalgias, nausea, neck pain, numbness, rash, sore throat, swollen glands, urinary symptoms, vertigo, visual change or vomiting. Associated symptoms comments: Patient had been unable to eat since tooth extraction the day before coming to the ED. Nothing aggravates the symptoms. She has tried nothing for the symptoms. The treatment provided no relief.    Past Medical History  Diagnosis Date  . Hypertension   . Hypercholesteremia    Past Surgical History  Procedure Laterality Date  . Foot surgery     History reviewed. No pertinent family history. History  Substance Use Topics  . Smoking status: Former Games developer  . Smokeless tobacco: Not on file  . Alcohol Use: Yes     Comment: occasionally   OB History   Grav Para Term Preterm Abortions TAB SAB Ect Mult Living                 Review of Systems  Constitutional: Positive for fatigue. Negative for fever, chills and diaphoresis.  HENT: Negative for congestion and sore throat.   Respiratory: Negative for cough.   Cardiovascular: Negative for chest pain.  Gastrointestinal: Positive for anorexia. Negative for nausea, vomiting and abdominal pain.  Musculoskeletal: Negative for joint swelling, myalgias and neck pain.  Skin: Negative  for rash.  Neurological: Positive for weakness and headaches. Negative for vertigo and numbness.    Allergies  Ciprofloxacin hcl; Sulfa antibiotics; and Penicillins  Home Medications   Current Outpatient Rx  Name  Route  Sig  Dispense  Refill  . aspirin 81 MG tablet   Oral   Take 81 mg by mouth daily.         . calcium citrate (CALCITRATE - DOSED IN MG ELEMENTAL CALCIUM) 950 MG tablet   Oral   Take 200 mg of elemental calcium by mouth daily.         Marland Kitchen doxycycline (DORYX) 100 MG DR capsule   Oral   Take 100 mg by mouth 2 (two) times daily.         Marland Kitchen l-methylfolate-B6-B12 (METANX) 3-35-2 MG TABS   Oral   Take 1 tablet by mouth daily.         . montelukast (SINGULAIR) 10 MG tablet   Oral   Take 10 mg by mouth at bedtime.         . Multiple Vitamin (MULTIVITAMIN) capsule   Oral   Take 1 capsule by mouth daily.         . carvedilol (COREG) 6.25 MG tablet   Oral   Take 6.25 mg by mouth 2 (two) times daily with a meal.           . fenofibrate (TRICOR) 48 MG tablet   Oral   Take 48 mg by mouth daily.           Marland Kitchen ibuprofen (ADVIL,MOTRIN) 200 MG tablet   Oral  Take 400 mg by mouth every 6 (six) hours as needed.           . isosorbide-hydrALAZINE (BIDIL) 20-37.5 MG per tablet   Oral   Take 1 tablet by mouth 3 (three) times daily.           Marland Kitchen losartan-hydrochlorothiazide (HYZAAR) 100-25 MG per tablet   Oral   Take 1 tablet by mouth daily.            BP 161/70  Pulse 62  Temp(Src) 98.5 F (36.9 C)  Resp 20  Ht 5\' 4"  (1.626 m)  Wt 150 lb (68.04 kg)  BMI 25.73 kg/m2  SpO2 100% Physical Exam  Constitutional: She is oriented to person, place, and time. She appears well-developed and well-nourished. No distress.  HENT:  Head: Normocephalic and atraumatic.  Mouth/Throat: Oropharynx is clear and moist. No oropharyngeal exudate.  Eyes: Conjunctivae and EOM are normal. Pupils are equal, round, and reactive to light.  Neck: Normal range of  motion. Neck supple.  Cardiovascular: Normal rate, normal heart sounds and intact distal pulses.  Exam reveals no gallop and no friction rub.   No murmur heard. Pulmonary/Chest: Effort normal and breath sounds normal. No respiratory distress. She has no wheezes. She has no rales.  Abdominal: Soft. Bowel sounds are normal. She exhibits no distension. There is no tenderness. There is no rebound and no guarding.  Neurological: She is alert and oriented to person, place, and time. No cranial nerve deficit. Coordination normal.  5/5 strength in major muscle groups of both UE and LE  Skin: She is not diaphoretic.  Psychiatric: She has a normal mood and affect. Her behavior is normal.    ED Course  Procedures (including critical care time) Labs Review Labs Reviewed  COMPREHENSIVE METABOLIC PANEL - Abnormal; Notable for the following:    Sodium 129 (*)    Chloride 91 (*)    Glucose, Bld 102 (*)    GFR calc non Af Amer 61 (*)    GFR calc Af Amer 71 (*)    All other components within normal limits  CBC WITH DIFFERENTIAL - Abnormal; Notable for the following:    HCT 35.3 (*)    Neutro Abs 7.8 (*)    Monocytes Absolute 1.1 (*)    All other components within normal limits   Imaging Review No results found.  EKG Interpretation     Ventricular Rate:  55 PR Interval:  212 QRS Duration: 80 QT Interval:  404 QTC Calculation: 386 R Axis:   63 Text Interpretation:  Sinus bradycardia with 1st degree A-V block Otherwise normal ECG No significant change since last tracing            MDM   1. Weakness     1. Weakness The patients weakness is likely 2/2 to dehydration in the setting of decreased PO intake due to recent tooth extraction. CVA unlikely given normal neuro exam. The patient has no trouble with ambulation. Plan to check CMP and CBC, and orthostatic vitals. After receiving 1 L NS patient reports feeling better. Labs were significant for mild hyponatremia. EKG was normal.  The  patient continued to have a normal neurologic exam. I recommended that the patient be discharge home with an increase in PO intake. I recommended ensure or boost. The patient and her daughter agreed with this plan. I explained that The patient should return to ED for new or worsening symptoms.   Pleas Koch, MD 07/20/13 1358

## 2013-07-22 ENCOUNTER — Encounter (HOSPITAL_BASED_OUTPATIENT_CLINIC_OR_DEPARTMENT_OTHER): Payer: Self-pay | Admitting: Emergency Medicine

## 2013-07-22 ENCOUNTER — Emergency Department (HOSPITAL_BASED_OUTPATIENT_CLINIC_OR_DEPARTMENT_OTHER)
Admission: EM | Admit: 2013-07-22 | Discharge: 2013-07-22 | Disposition: A | Discharge status: Home / Self Care | Payer: Medicare Other | Attending: Emergency Medicine | Admitting: Emergency Medicine

## 2013-07-22 DIAGNOSIS — Z7982 Long term (current) use of aspirin: Secondary | ICD-10-CM | POA: Diagnosis not present

## 2013-07-22 DIAGNOSIS — R5381 Other malaise: Secondary | ICD-10-CM | POA: Diagnosis not present

## 2013-07-22 DIAGNOSIS — Z87891 Personal history of nicotine dependence: Secondary | ICD-10-CM | POA: Diagnosis not present

## 2013-07-22 DIAGNOSIS — Z8639 Personal history of other endocrine, nutritional and metabolic disease: Secondary | ICD-10-CM | POA: Insufficient documentation

## 2013-07-22 DIAGNOSIS — Z880 Allergy status to penicillin: Secondary | ICD-10-CM | POA: Diagnosis not present

## 2013-07-22 DIAGNOSIS — R531 Weakness: Secondary | ICD-10-CM

## 2013-07-22 DIAGNOSIS — Z79899 Other long term (current) drug therapy: Secondary | ICD-10-CM | POA: Insufficient documentation

## 2013-07-22 DIAGNOSIS — I10 Essential (primary) hypertension: Secondary | ICD-10-CM | POA: Insufficient documentation

## 2013-07-22 DIAGNOSIS — Z862 Personal history of diseases of the blood and blood-forming organs and certain disorders involving the immune mechanism: Secondary | ICD-10-CM | POA: Diagnosis not present

## 2013-07-22 DIAGNOSIS — R011 Cardiac murmur, unspecified: Secondary | ICD-10-CM | POA: Diagnosis not present

## 2013-07-22 LAB — COMPREHENSIVE METABOLIC PANEL
ALT: 12 U/L (ref 0–35)
Albumin: 3.4 g/dL — ABNORMAL LOW (ref 3.5–5.2)
Alkaline Phosphatase: 50 U/L (ref 39–117)
BUN: 13 mg/dL (ref 6–23)
Calcium: 10 mg/dL (ref 8.4–10.5)
Potassium: 3.5 mEq/L (ref 3.5–5.1)
Sodium: 135 mEq/L (ref 135–145)
Total Protein: 7.1 g/dL (ref 6.0–8.3)

## 2013-07-22 LAB — URINALYSIS, ROUTINE W REFLEX MICROSCOPIC
Bilirubin Urine: NEGATIVE
Ketones, ur: NEGATIVE mg/dL
Nitrite: NEGATIVE
Protein, ur: NEGATIVE mg/dL
Urobilinogen, UA: 0.2 mg/dL (ref 0.0–1.0)

## 2013-07-22 LAB — CBC WITH DIFFERENTIAL/PLATELET
Basophils Relative: 0 % (ref 0–1)
Eosinophils Absolute: 0.1 10*3/uL (ref 0.0–0.7)
MCH: 30.8 pg (ref 26.0–34.0)
MCHC: 33.4 g/dL (ref 30.0–36.0)
Neutrophils Relative %: 67 % (ref 43–77)
Platelets: 210 10*3/uL (ref 150–400)

## 2013-07-22 MED ORDER — SODIUM CHLORIDE 0.9 % IV BOLUS (SEPSIS)
500.0000 mL | Freq: Once | INTRAVENOUS | Status: AC
  Administered 2013-07-22: 500 mL via INTRAVENOUS

## 2013-07-22 MED ORDER — ONDANSETRON HCL 4 MG/2ML IJ SOLN
4.0000 mg | Freq: Once | INTRAMUSCULAR | Status: AC
  Administered 2013-07-22: 4 mg via INTRAVENOUS
  Filled 2013-07-22: qty 2

## 2013-07-22 NOTE — ED Notes (Signed)
Pt c/o generalized weakness x 1 day seen here 2 days ago for same, pt has not f/u with PMD

## 2013-07-22 NOTE — ED Provider Notes (Signed)
CSN: [id number]     Arrival date & time 07/22/13  1441 History   First MD Initiated Contact with Patient 07/22/13 1452     Chief Complaint  Patient presents with  . Weakness   (Consider location/radiation/quality/duration/timing/severity/associated sxs/prior Treatment) Patient is a 77 y.o. female presenting with weakness. The history is provided by the patient. No language interpreter was used.  Weakness This is a new problem. The current episode started in the past 7 days. Associated symptoms include weakness. Pertinent negatives include no abdominal pain, chest pain, chills, coughing, fever, myalgias, nausea, rash or vomiting. Associated symptoms comments: She complains of being generally weak without pain, N, V, D, fever, cough or SOB. She was seen 2 days ago in the emergency department and diagnosed, per patient, with dehydration and near-syncope. She reports feeling better with IV fluids given during that visit and even more improved yesterday, with recurrent generalized weakness today. She continues to drink plenty of fluids as well as Ensure. .    Past Medical History  Diagnosis Date  . Hypertension   . Hypercholesteremia    Past Surgical History  Procedure Laterality Date  . Foot surgery     History reviewed. No pertinent family history. History  Substance Use Topics  . Smoking status: Former Games developer  . Smokeless tobacco: Not on file  . Alcohol Use: Yes     Comment: occasionally   OB History   Grav Para Term Preterm Abortions TAB SAB Ect Mult Living                 Review of Systems  Constitutional: Negative for fever and chills.  Eyes: Negative.  Negative for visual disturbance.  Respiratory: Negative.  Negative for cough and shortness of breath.   Cardiovascular: Negative.  Negative for chest pain and leg swelling.  Gastrointestinal: Negative.  Negative for nausea, vomiting, abdominal pain and diarrhea.  Genitourinary: Negative for dysuria.  Musculoskeletal:  Negative.  Negative for myalgias.  Skin: Negative.  Negative for rash.  Neurological: Positive for weakness. Negative for syncope.  Psychiatric/Behavioral: Negative for confusion.    Allergies  Ciprofloxacin hcl; Sulfa antibiotics; and Penicillins  Home Medications   Current Outpatient Rx  Name  Route  Sig  Dispense  Refill  . aspirin 81 MG tablet   Oral   Take 81 mg by mouth daily.         . calcium citrate (CALCITRATE - DOSED IN MG ELEMENTAL CALCIUM) 950 MG tablet   Oral   Take 200 mg of elemental calcium by mouth daily.         . carvedilol (COREG) 6.25 MG tablet   Oral   Take 6.25 mg by mouth 2 (two) times daily with a meal.           . doxycycline (DORYX) 100 MG DR capsule   Oral   Take 100 mg by mouth 2 (two) times daily.         . fenofibrate (TRICOR) 48 MG tablet   Oral   Take 48 mg by mouth daily.           Marland Kitchen ibuprofen (ADVIL,MOTRIN) 200 MG tablet   Oral   Take 400 mg by mouth every 6 (six) hours as needed.           . isosorbide-hydrALAZINE (BIDIL) 20-37.5 MG per tablet   Oral   Take 1 tablet by mouth 3 (three) times daily.           Marland Kitchen l-methylfolate-B6-B12 [facility])  3-35-2 MG TABS   Oral   Take 1 tablet by mouth daily.         Marland Kitchen losartan-hydrochlorothiazide (HYZAAR) 100-25 MG per tablet   Oral   Take 1 tablet by mouth daily.           . montelukast (SINGULAIR) 10 MG tablet   Oral   Take 10 mg by mouth at bedtime.         . Multiple Vitamin (MULTIVITAMIN) capsule   Oral   Take 1 capsule by mouth daily.          BP 180/77  Pulse 71  Temp(Src) 98.5 F (36.9 C) (Oral)  Resp 16  Ht 5\' 4"  (1.626 m)  Wt 150 lb (68.04 kg)  BMI 25.73 kg/m2  SpO2 99% Physical Exam  Constitutional: She is oriented to person, place, and time. She appears well-developed and well-nourished.  HENT:  Head: Normocephalic.  Neck: Normal range of motion. Neck supple.  Cardiovascular: Normal rate and regular rhythm.   Murmur heard. Pulmonary/Chest:  Effort normal and breath sounds normal.  Abdominal: Soft. Bowel sounds are normal. There is no tenderness. There is no rebound and no guarding.  Musculoskeletal: Normal range of motion.  Neurological: She is alert and oriented to person, place, and time.  Skin: Skin is warm and dry. No rash noted.  Psychiatric: She has a normal mood and affect.    ED Course  Procedures (including critical care time) Labs Review Labs Reviewed  CBC WITH DIFFERENTIAL - Abnormal; Notable for the following:    RBC 3.83 (*)    Hemoglobin 11.8 (*)    HCT 35.3 (*)    All other components within normal limits  COMPREHENSIVE METABOLIC PANEL - Abnormal; Notable for the following:    Glucose, Bld 134 (*)    Albumin 3.4 (*)    Total Bilirubin 0.2 (*)    GFR calc non Af Amer 70 (*)    GFR calc Af Amer 82 (*)    All other components within normal limits  URINE CULTURE  URINALYSIS, ROUTINE W REFLEX MICROSCOPIC   Imaging Review No results found.  EKG Interpretation   None       MDM  No diagnosis found. 1. Weakness, generalized  VSS. Labs/chart reviewed from visit 07/20/13. There is improvement in sodium from 129 on previous visit to 135 today. No evidence of dehydration or acute illness to account for symptoms of weakness. Dr. Rosalia Hammers has evaluated the patient and feels she is stable for discharge.     Arnoldo Hooker, PA-C 07/22/13 1705

## 2013-07-22 NOTE — ED Provider Notes (Signed)
  I performed a history and physical examination of Faith Ritter and discussed her management with Faith Ritter.  I agree with the history, physical, assessment, and plan of care, with the following exceptions: None  I was present for the following procedures: None Time Spent in Critical Care of the patient: None Time spent in discussions with the patient and family: 8  Faith Ritter is a 77 y.o. Female with two days of generalized weakness.  She states her hands and feet have felt tingly.  She was seen here two days ago and felt to be mildly dehydrated with slighlty low sodium.  Today her sodium is higher.  She has no focal deficits and has a normal gait.  Return precautions given and patient to follow up with Dr.Kim next week.  Holli Humbles, MD 07/22/13 [phone]

## 2013-07-25 DIAGNOSIS — F411 Generalized anxiety disorder: Secondary | ICD-10-CM | POA: Diagnosis not present

## 2013-07-25 DIAGNOSIS — I10 Essential (primary) hypertension: Secondary | ICD-10-CM | POA: Diagnosis not present

## 2013-07-25 DIAGNOSIS — R42 Dizziness and giddiness: Secondary | ICD-10-CM | POA: Diagnosis not present

## 2013-07-25 LAB — URINE CULTURE

## 2013-08-01 ENCOUNTER — Emergency Department (HOSPITAL_BASED_OUTPATIENT_CLINIC_OR_DEPARTMENT_OTHER)
Admission: EM | Admit: 2013-08-01 | Discharge: 2013-08-01 | Disposition: A | Discharge status: Home / Self Care | Payer: Medicare Other | Attending: Emergency Medicine | Admitting: Emergency Medicine

## 2013-08-01 ENCOUNTER — Encounter (HOSPITAL_BASED_OUTPATIENT_CLINIC_OR_DEPARTMENT_OTHER): Payer: Self-pay | Admitting: Emergency Medicine

## 2013-08-01 DIAGNOSIS — Z862 Personal history of diseases of the blood and blood-forming organs and certain disorders involving the immune mechanism: Secondary | ICD-10-CM | POA: Insufficient documentation

## 2013-08-01 DIAGNOSIS — R011 Cardiac murmur, unspecified: Secondary | ICD-10-CM | POA: Insufficient documentation

## 2013-08-01 DIAGNOSIS — R6883 Chills (without fever): Secondary | ICD-10-CM | POA: Diagnosis not present

## 2013-08-01 DIAGNOSIS — Z87891 Personal history of nicotine dependence: Secondary | ICD-10-CM | POA: Insufficient documentation

## 2013-08-01 DIAGNOSIS — Z7982 Long term (current) use of aspirin: Secondary | ICD-10-CM | POA: Insufficient documentation

## 2013-08-01 DIAGNOSIS — R82998 Other abnormal findings in urine: Secondary | ICD-10-CM | POA: Diagnosis not present

## 2013-08-01 DIAGNOSIS — Z8639 Personal history of other endocrine, nutritional and metabolic disease: Secondary | ICD-10-CM | POA: Insufficient documentation

## 2013-08-01 DIAGNOSIS — I10 Essential (primary) hypertension: Secondary | ICD-10-CM | POA: Insufficient documentation

## 2013-08-01 DIAGNOSIS — R8271 Bacteriuria: Secondary | ICD-10-CM

## 2013-08-01 DIAGNOSIS — R531 Weakness: Secondary | ICD-10-CM

## 2013-08-01 DIAGNOSIS — R110 Nausea: Secondary | ICD-10-CM | POA: Diagnosis not present

## 2013-08-01 DIAGNOSIS — Z880 Allergy status to penicillin: Secondary | ICD-10-CM | POA: Insufficient documentation

## 2013-08-01 DIAGNOSIS — R51 Headache: Secondary | ICD-10-CM | POA: Diagnosis not present

## 2013-08-01 DIAGNOSIS — Z79899 Other long term (current) drug therapy: Secondary | ICD-10-CM | POA: Diagnosis not present

## 2013-08-01 DIAGNOSIS — R5381 Other malaise: Secondary | ICD-10-CM | POA: Diagnosis not present

## 2013-08-01 LAB — CBC WITH DIFFERENTIAL/PLATELET
Basophils Relative: 0 % (ref 0–1)
Eosinophils Relative: 0 % (ref 0–5)
Hemoglobin: 12.1 g/dL (ref 12.0–15.0)
Lymphocytes Relative: 14 % (ref 12–46)
Lymphs Abs: 1.6 10*3/uL (ref 0.7–4.0)
MCV: 91.2 fL (ref 78.0–100.0)
Monocytes Relative: 10 % (ref 3–12)
Neutro Abs: 8.9 10*3/uL — ABNORMAL HIGH (ref 1.7–7.7)
Neutrophils Relative %: 76 % (ref 43–77)
RBC: 3.88 MIL/uL (ref 3.87–5.11)
RDW: 13.8 % (ref 11.5–15.5)
WBC: 11.7 10*3/uL — ABNORMAL HIGH (ref 4.0–10.5)

## 2013-08-01 LAB — URINALYSIS, ROUTINE W REFLEX MICROSCOPIC
Glucose, UA: NEGATIVE mg/dL
Hgb urine dipstick: NEGATIVE
Ketones, ur: NEGATIVE mg/dL
Nitrite: NEGATIVE
Protein, ur: NEGATIVE mg/dL
Urobilinogen, UA: 0.2 mg/dL (ref 0.0–1.0)

## 2013-08-01 LAB — COMPREHENSIVE METABOLIC PANEL
Albumin: 3.5 g/dL (ref 3.5–5.2)
Alkaline Phosphatase: 48 U/L (ref 39–117)
BUN: 12 mg/dL (ref 6–23)
CO2: 22 mEq/L (ref 19–32)
Calcium: 9.2 mg/dL (ref 8.4–10.5)
Chloride: 101 mEq/L (ref 96–112)
Creatinine, Ser: 0.7 mg/dL (ref 0.50–1.10)
GFR calc non Af Amer: 83 mL/min — ABNORMAL LOW (ref >90)
Potassium: 3.6 mEq/L (ref 3.5–5.1)
Total Bilirubin: 0.4 mg/dL (ref 0.3–1.2)

## 2013-08-01 LAB — URINE MICROSCOPIC-ADD ON

## 2013-08-01 MED ORDER — ACETAMINOPHEN 160 MG/5ML PO SOLN
650.0000 mg | Freq: Once | ORAL | Status: AC
  Administered 2013-08-01: 650 mg via ORAL
  Filled 2013-08-01: qty 20.3

## 2013-08-01 MED ORDER — SODIUM CHLORIDE 0.9 % IV BOLUS (SEPSIS)
1000.0000 mL | Freq: Once | INTRAVENOUS | Status: AC
  Administered 2013-08-01: 1000 mL via INTRAVENOUS

## 2013-08-01 NOTE — ED Notes (Signed)
Pt. Has no neuro symptoms. Pt. Is alert and oriented.

## 2013-08-01 NOTE — ED Provider Notes (Signed)
CSN: [id number]     Arrival date & time 08/01/13  1759 History  This chart was scribed for Faith Churn, MD by Leone Payor, ED Scribe. This patient was seen in room MH01/MH01 and the patient's care was started 7:13 PM.    Chief Complaint  Patient presents with  . Weakness    Patient is a 77 y.o. female presenting with weakness. The history is provided by the patient. No language interpreter was used.  Weakness This is a recurrent problem. The current episode started 2 days ago. The problem occurs constantly. The problem has been gradually worsening. Associated symptoms include headaches. Pertinent negatives include no chest pain and no shortness of breath. Nothing aggravates the symptoms. Nothing relieves the symptoms. She has tried nothing for the symptoms. The treatment provided no relief.    HPI Comments: Faith Ritter is a 77 y.o. female who presents to the Emergency Department complaining of constant, worsened generalized weakness and fatigue that began in the last few days. Pt was seen here twice about 2 weeks ago with similar symptoms and had fluid resuscitation due to dehydration. Pt states she went to see her PCP last week who ordered several scans and labs. She also reports having a HA, chills, nausea, and feels nervous and panicked which began this morning. She denies fever, chest pain, SOB, vomiting, diarrhea, dysuria.    Past Medical History  Diagnosis Date  . Hypertension   . Hypercholesteremia    Past Surgical History  Procedure Laterality Date  . Foot surgery     No family history on file. History  Substance Use Topics  . Smoking status: Former Games developer  . Smokeless tobacco: Not on file  . Alcohol Use: No     Comment: occasionally   OB History   Grav Para Term Preterm Abortions TAB SAB Ect Mult Living                 Review of Systems  Constitutional: Positive for chills. Negative for fever.  Respiratory: Negative for shortness of breath.    Cardiovascular: Negative for chest pain.  Gastrointestinal: Positive for nausea. Negative for vomiting and diarrhea.  Genitourinary: Negative for dysuria.  Neurological: Positive for weakness and headaches.  All other systems reviewed and are negative.    Allergies  Ciprofloxacin hcl; Sulfa antibiotics; and Penicillins  Home Medications   Current Outpatient Rx  Name  Route  Sig  Dispense  Refill  . amLODipine (NORVASC) 5 MG tablet   Oral   Take 5 mg by mouth daily.         Marland Kitchen FLUoxetine (PROZAC) 10 MG capsule   Oral   Take 10 mg by mouth daily.         Marland Kitchen olmesartan (BENICAR) 40 MG tablet   Oral   Take 40 mg by mouth daily.         Marland Kitchen aspirin 81 MG tablet   Oral   Take 81 mg by mouth daily.         . calcium citrate (CALCITRATE - DOSED IN MG ELEMENTAL CALCIUM) 950 MG tablet   Oral   Take 200 mg of elemental calcium by mouth daily.         . carvedilol (COREG) 6.25 MG tablet   Oral   Take 6.25 mg by mouth 2 (two) times daily with a meal.           . doxycycline (DORYX) 100 MG DR capsule   Oral   Take  100 mg by mouth 2 (two) times daily.         . fenofibrate (TRICOR) 48 MG tablet   Oral   Take 48 mg by mouth daily.           Marland Kitchen ibuprofen (ADVIL,MOTRIN) 200 MG tablet   Oral   Take 400 mg by mouth every 6 (six) hours as needed.           . isosorbide-hydrALAZINE (BIDIL) 20-37.5 MG per tablet   Oral   Take 1 tablet by mouth 3 (three) times daily.           Marland Kitchen l-methylfolate-B6-B12 (METANX) 3-35-2 MG TABS   Oral   Take 1 tablet by mouth daily.         Marland Kitchen losartan-hydrochlorothiazide (HYZAAR) 100-25 MG per tablet   Oral   Take 1 tablet by mouth daily.          . montelukast (SINGULAIR) 10 MG tablet   Oral   Take 10 mg by mouth at bedtime.         . Multiple Vitamin (MULTIVITAMIN) capsule   Oral   Take 1 capsule by mouth daily.          Triage Vitals: BP 143/75  Pulse 73  Temp(Src) 98.9 F (37.2 C) (Oral)  Resp 18  Ht 5'  3" (1.6 m)  Wt 150 lb (68.04 kg)  BMI 26.58 kg/m2  SpO2 98% Physical Exam  Nursing note and vitals reviewed. Constitutional: She is oriented to person, place, and time. She appears well-developed and well-nourished. No distress.  HENT:  Head: Normocephalic and atraumatic.  Mouth/Throat: Oropharynx is clear and moist.  Eyes: Conjunctivae are normal. Pupils are equal, round, and reactive to light. No scleral icterus.  Neck: Neck supple.  Cardiovascular: Normal rate, regular rhythm and intact distal pulses.   Murmur heard.  Systolic murmur is present with a grade of 2/6  Pulmonary/Chest: Effort normal and breath sounds normal. No stridor. No respiratory distress. She has no rales.  Abdominal: Soft. Bowel sounds are normal. She exhibits no distension. There is no tenderness.  Musculoskeletal: Normal range of motion.  Neurological: She is alert and oriented to person, place, and time. She has normal strength. No cranial nerve deficit or sensory deficit. She displays a negative Romberg sign. Coordination and gait normal. GCS eye subscore is 4. GCS verbal subscore is 5. GCS motor subscore is 6.  Skin: Skin is warm and dry. No rash noted.  Psychiatric: She has a normal mood and affect. Her behavior is normal.    ED Course  Procedures  DIAGNOSTIC STUDIES: Oxygen Saturation is 98% on RA, normal by my interpretation.    COORDINATION OF CARE: 7:23 PM Discussed treatment plan with pt at bedside and pt agreed to plan.    Labs Review Labs Reviewed  URINALYSIS, ROUTINE W REFLEX MICROSCOPIC - Abnormal; Notable for the following:    Leukocytes, UA TRACE (*)    All other components within normal limits  CBC WITH DIFFERENTIAL - Abnormal; Notable for the following:    WBC 11.7 (*)    HCT 35.4 (*)    Neutro Abs 8.9 (*)    Monocytes Absolute 1.1 (*)    All other components within normal limits  COMPREHENSIVE METABOLIC PANEL - Abnormal; Notable for the following:    Glucose, Bld 106 (*)    GFR  calc non Af Amer 83 (*)    All other components within normal limits  URINE MICROSCOPIC-ADD ON  Imaging Review No results found.  EKG Interpretation    Date/Time:  Monday August 01 2013 18:14:51 EST Ventricular Rate:  68 PR Interval:  192 QRS Duration: 80 QT Interval:  392 QTC Calculation: 416 R Axis:   73 Text Interpretation:  Sinus rhythm with Premature atrial complexes Otherwise normal ECG Compared to prior, PRI shorter, PAC present.   Confirmed by [facility]  MD, TREY ([id number]) on 08/01/2013 7:49:36 PM            MDM   1. Weakness   2. Bacteriuria    77 yo female presenting with fatigue.  Symptoms present for several weeks, getting worse now.  Her primary doctor is in the process of evaluating this fatigue.  On exam, she was very well appearing, nontoxic, not distressed.  Had normal strength and neurologic testing.  Able to get out of bed easily and ambulated normally.  Lab workup unremarkable.  She had small leuks on urine and a previous urine culture grew out 10,000 enterococcus.  This likely does not represent active infection (even with minimal leukocytosis) and she has multiple drug allergies which would make treatment more risky than beneficial.  She will follow up with her primary doctor for further workup.    I personally performed the services described in this documentation, which was scribed in my presence. The recorded information has been reviewed and is accurate.     Faith Churn, MD 08/02/13 [phone]

## 2013-08-01 NOTE — ED Notes (Signed)
Pt. Was seen here 2 wks ago for fluid resusitation due to dehydration.  Pt. Feels the same kind of weakness she felt 2 wks ago.  Pt. Has had no vomiting or diarrhea and reports some nausea.

## 2013-08-02 DIAGNOSIS — N390 Urinary tract infection, site not specified: Secondary | ICD-10-CM | POA: Diagnosis not present

## 2013-08-02 DIAGNOSIS — R42 Dizziness and giddiness: Secondary | ICD-10-CM | POA: Diagnosis not present

## 2013-08-02 DIAGNOSIS — R5381 Other malaise: Secondary | ICD-10-CM | POA: Diagnosis not present

## 2013-08-02 DIAGNOSIS — I10 Essential (primary) hypertension: Secondary | ICD-10-CM | POA: Diagnosis not present

## 2013-08-15 DIAGNOSIS — R42 Dizziness and giddiness: Secondary | ICD-10-CM | POA: Diagnosis not present

## 2013-09-05 DIAGNOSIS — R7309 Other abnormal glucose: Secondary | ICD-10-CM | POA: Diagnosis not present

## 2013-09-05 DIAGNOSIS — R209 Unspecified disturbances of skin sensation: Secondary | ICD-10-CM | POA: Diagnosis not present

## 2013-09-06 ENCOUNTER — Encounter: Payer: Self-pay | Admitting: Neurology

## 2013-09-06 DIAGNOSIS — Z0000 Encounter for general adult medical examination without abnormal findings: Secondary | ICD-10-CM | POA: Diagnosis not present

## 2013-09-06 DIAGNOSIS — R7309 Other abnormal glucose: Secondary | ICD-10-CM | POA: Diagnosis not present

## 2013-09-06 DIAGNOSIS — I10 Essential (primary) hypertension: Secondary | ICD-10-CM | POA: Diagnosis not present

## 2013-09-19 DIAGNOSIS — B029 Zoster without complications: Secondary | ICD-10-CM | POA: Diagnosis not present

## 2013-09-19 DIAGNOSIS — R300 Dysuria: Secondary | ICD-10-CM | POA: Diagnosis not present

## 2013-09-23 ENCOUNTER — Other Ambulatory Visit: Payer: Self-pay | Admitting: Internal Medicine

## 2013-09-23 DIAGNOSIS — R209 Unspecified disturbances of skin sensation: Secondary | ICD-10-CM

## 2013-09-23 DIAGNOSIS — M79609 Pain in unspecified limb: Secondary | ICD-10-CM | POA: Diagnosis not present

## 2013-09-23 DIAGNOSIS — R634 Abnormal weight loss: Secondary | ICD-10-CM | POA: Diagnosis not present

## 2013-09-29 DIAGNOSIS — I10 Essential (primary) hypertension: Secondary | ICD-10-CM | POA: Diagnosis not present

## 2013-09-29 DIAGNOSIS — R51 Headache: Secondary | ICD-10-CM | POA: Diagnosis not present

## 2013-10-03 ENCOUNTER — Encounter: Payer: Self-pay | Admitting: Neurology

## 2013-10-03 ENCOUNTER — Ambulatory Visit (INDEPENDENT_AMBULATORY_CARE_PROVIDER_SITE_OTHER): Payer: Medicare Other | Admitting: Neurology

## 2013-10-03 ENCOUNTER — Encounter (INDEPENDENT_AMBULATORY_CARE_PROVIDER_SITE_OTHER): Payer: Self-pay

## 2013-10-03 ENCOUNTER — Ambulatory Visit
Admission: RE | Admit: 2013-10-03 | Discharge: 2013-10-03 | Disposition: A | Discharge status: Home / Self Care | Payer: Medicare Other | Source: Ambulatory Visit | Attending: Internal Medicine | Admitting: Internal Medicine

## 2013-10-03 VITALS — BP 170/83 | HR 66 | Ht 61.0 in | Wt 154.0 lb

## 2013-10-03 DIAGNOSIS — B029 Zoster without complications: Secondary | ICD-10-CM | POA: Insufficient documentation

## 2013-10-03 DIAGNOSIS — R209 Unspecified disturbances of skin sensation: Secondary | ICD-10-CM

## 2013-10-03 DIAGNOSIS — M4712 Other spondylosis with myelopathy, cervical region: Secondary | ICD-10-CM

## 2013-10-03 DIAGNOSIS — G959 Disease of spinal cord, unspecified: Secondary | ICD-10-CM | POA: Insufficient documentation

## 2013-10-03 MED ORDER — GADOBENATE DIMEGLUMINE 529 MG/ML IV SOLN
14.0000 mL | Freq: Once | INTRAVENOUS | Status: AC | PRN
  Administered 2013-10-03: 14 mL via INTRAVENOUS

## 2013-10-03 NOTE — Progress Notes (Signed)
[facility]  PATIENT: Faith Ritter DOB: [birth year]  HISTORICAL Faith Ritter is a 78 years old right-handed Caucasian female, referred by her primary care physician Dr. Mignon Pine for evaluation of bilateral upper and lower extremity paresthesia  She had a past medical history of hypertension, hyperlipidemia, I saw her previously in 2013 for bilateral feet feeling cold, it was essentially normal neurological examination then, there was no hyperreflexia found, she had a previous history of lower lumbar shingles in the past,  She suffered a right C2shingles in early January 20/15, right side skull pain, rash at occipital region, this followed a urinary tract infection in November 2014, she was treated with antivirus medication for 2 weeks, with the onset of right skull rash, She also noticed numbness tingling of bilateral upper and lower extremities, starting from bilateral feet, travel to bilateral knee level, bilateral hands to bilateral elbow level.  Over the past few weeks, her symptoms has much improved, she only has mild discomfort at bilateral plantar feet, and her fingers, even at its worst, she denies gait difficulty, no bowel bladder incontinence, she was treated with gabapentin 300 mg 2 tablets 3 times a day, now on tapering dose, she also complains of frequent mild bilateral frontal headaches, she denies visual change,   REVIEW OF SYSTEMS: Full 14 system review of systems performed and notable only for headache, numbness, anxious  ALLERGIES: Allergies  Allergen Reactions  . Ciprofloxacin Hcl   . Sulfa Antibiotics   . Penicillins Hives and Rash    HOME MEDICATIONS: Outpatient Prescriptions Prior to Visit  Medication Sig Dispense Refill  . amLODipine (NORVASC) 5 MG tablet Take 5 mg by mouth daily.      Marland Kitchen aspirin 81 MG tablet Take 81 mg by mouth daily.      . calcium citrate (CALCITRATE - DOSED IN MG ELEMENTAL CALCIUM) 950 MG tablet Take 200 mg of elemental  calcium by mouth daily.      . carvedilol (COREG) 6.25 MG tablet Take 6.25 mg by mouth 2 (two) times daily with a meal.        . doxycycline (DORYX) 100 MG DR capsule Take 100 mg by mouth 2 (two) times daily.      . fenofibrate (TRICOR) 48 MG tablet Take 48 mg by mouth daily.        Marland Kitchen FLUoxetine (PROZAC) 10 MG capsule Take 10 mg by mouth daily.      Marland Kitchen ibuprofen (ADVIL,MOTRIN) 200 MG tablet Take 400 mg by mouth every 6 (six) hours as needed.        . isosorbide-hydrALAZINE (BIDIL) 20-37.5 MG per tablet Take 1 tablet by mouth 3 (three) times daily.        Marland Kitchen l-methylfolate-B6-B12 (METANX) 3-35-2 MG TABS Take 1 tablet by mouth daily.      Marland Kitchen losartan-hydrochlorothiazide (HYZAAR) 100-25 MG per tablet Take 1 tablet by mouth daily.       . montelukast (SINGULAIR) 10 MG tablet Take 10 mg by mouth at bedtime.      . Multiple Vitamin (MULTIVITAMIN) capsule Take 1 capsule by mouth daily.      Marland Kitchen olmesartan (BENICAR) 40 MG tablet Take 40 mg by mouth daily.       No facility-administered medications prior to visit.    PAST MEDICAL HISTORY: Past Medical History  Diagnosis Date  . Hypertension   . Hypercholesteremia     PAST SURGICAL HISTORY: Past Surgical History  Procedure Laterality Date  . Foot surgery Right   .  Cataract extraction Bilateral     FAMILY HISTORY: Family History  Problem Relation Age of Onset  . Heart attack Father   . Stroke Mother     SOCIAL HISTORY:  History   Social History  . Marital Status: Divorced    Spouse Name: N/A    Number of Children: 46  . Years of Education: college   Occupational History    retired   Social History Main Topics  . Smoking status: Former Smoker    Types: Cigarettes  . Smokeless tobacco: Never Used  . Alcohol Use: No     Comment: occasionally  . Drug Use: No  . Sexual Activity: Not on file   Other Topics Concern  . Not on file   Social History Narrative   Patient lives in [facility] community [facility] [place]- 7  Years.    Retired.   Education- some college   Left handed.   Caffeine- Two cups daily.    PHYSICAL EXAM   Filed Vitals:   10/03/13 1125  BP: 170/83  Pulse: 66  Height: 5\' 1"  (1.549 m)  Weight: 154 lb (69.854 kg)    Not recorded    Body mass index is 29.11 kg/(m^2).   Generalized: In no acute distress  Neck: Supple, no carotid bruits   Cardiac: Regular rate rhythm  Pulmonary: Clear to auscultation bilaterally  Musculoskeletal: No deformity  Neurological examination  Mentation: Alert oriented to time, place, history taking, and causual conversation  Cranial nerve II-XII: Pupils were equal round reactive to light extraocular movements were full, Visual field were full on confrontational test. Bilateral fundi were sharp.  Facial sensation and strength were normal. Hearing was intact to finger rubbing bilaterally. Uvula tongue midline.  head turning and shoulder shrug and were normal and symmetric.Tongue protrusion into cheek strength was normal.  Motor: normal tone, bulk and strength.  Sensory: Intact to fine touch, pinprick, preserved vibratory sensation, and proprioception at toes.  Coordination: Normal finger to nose, heel-to-shin bilaterally there was no truncal ataxia  Gait: Rising up from seated position without assistance, normal stance, without trunk ataxia, moderate stride, good arm swing, smooth turning, able to perform tiptoe, and heel walking without difficulty.   Romberg signs: Negative  Deep tendon reflexes: Brachioradialis 3/3, biceps 3/3, triceps 3/3, patellar 3/3, Achilles 2/2, plantar responses were flexor bilaterally.   DIAGNOSTIC DATA (LABS, IMAGING, TESTING) - I reviewed patient records, labs, notes, testing and imaging myself where available.  Lab Results  Component Value Date   WBC 11.7* 08/01/2013   HGB 12.1 08/01/2013   HCT 35.4* 08/01/2013   MCV 91.2 08/01/2013   PLT 225 08/01/2013      Component Value Date/Time   NA 136 08/01/2013 1900   K  3.6 08/01/2013 1900   CL 101 08/01/2013 1900   CO2 22 08/01/2013 1900   GLUCOSE 106* 08/01/2013 1900   BUN 12 08/01/2013 1900   CREATININE 0.70 08/01/2013 1900   CALCIUM 9.2 08/01/2013 1900   PROT 6.7 08/01/2013 1900   ALBUMIN 3.5 08/01/2013 1900   AST 17 08/01/2013 1900   ALT 12 08/01/2013 1900   ALKPHOS 48 08/01/2013 1900   BILITOT 0.4 08/01/2013 1900   GFRNONAA 83* 08/01/2013 1900   GFRAA >90 08/01/2013 1900     ASSESSMENT AND PLAN   78 years old Caucasian female, with right C2 shingle, presented with bilaterally upper and lower extremity paresthesia, overall has improved, she was found to be hyperreflexia of the upper and lower extremities, could indicate  cervical spondylitic vs. transverse myelitis,  1 MRI of the brain, and cervical spine is planned by her primary care physician Dr. Maudie Mercury.  2.  her symptoms overall has much improved, keep him tapering down Neurontin, 3 return to clinic in one month, I will call her MRI report,         Marcial Pacas, M.D. Ph.D.  [facility] [address] [place], [place] [address] [phone]

## 2013-10-04 DIAGNOSIS — M47812 Spondylosis without myelopathy or radiculopathy, cervical region: Secondary | ICD-10-CM | POA: Diagnosis not present

## 2013-10-05 ENCOUNTER — Telehealth: Payer: Self-pay | Admitting: Neurology

## 2013-10-05 ENCOUNTER — Other Ambulatory Visit: Payer: Self-pay | Admitting: Internal Medicine

## 2013-10-05 DIAGNOSIS — L989 Disorder of the skin and subcutaneous tissue, unspecified: Secondary | ICD-10-CM

## 2013-10-05 DIAGNOSIS — R634 Abnormal weight loss: Secondary | ICD-10-CM

## 2013-10-05 DIAGNOSIS — R52 Pain, unspecified: Secondary | ICD-10-CM

## 2013-10-05 DIAGNOSIS — R911 Solitary pulmonary nodule: Secondary | ICD-10-CM

## 2013-10-05 NOTE — Telephone Encounter (Signed)
I have called Faith Ritter, fail to reach her, Butch Penny, please call again  MRI brain showed mild small vessel disease, age related findings,  MRI cervical showed multi-level degenerative disease, no canal stenosis, no cord signal abnormalities

## 2013-10-06 NOTE — Telephone Encounter (Signed)
Left message with MRI brain and MRI cervical results, per Dr. Krista Blue.  Told to call with any questions.

## 2013-10-07 ENCOUNTER — Other Ambulatory Visit (HOSPITAL_COMMUNITY): Payer: Self-pay | Admitting: Internal Medicine

## 2013-10-07 DIAGNOSIS — S14116A Complete lesion at C6 level of cervical spinal cord, initial encounter: Secondary | ICD-10-CM

## 2013-10-10 ENCOUNTER — Other Ambulatory Visit: Payer: Medicare Other

## 2013-10-11 DIAGNOSIS — D235 Other benign neoplasm of skin of trunk: Secondary | ICD-10-CM | POA: Diagnosis not present

## 2013-10-11 DIAGNOSIS — Z85820 Personal history of malignant melanoma of skin: Secondary | ICD-10-CM | POA: Diagnosis not present

## 2013-10-13 ENCOUNTER — Encounter (HOSPITAL_COMMUNITY)
Admission: RE | Admit: 2013-10-13 | Discharge: 2013-10-13 | Disposition: A | Discharge status: Home / Self Care | Payer: Medicare Other | Source: Ambulatory Visit | Attending: Internal Medicine | Admitting: Internal Medicine

## 2013-10-13 ENCOUNTER — Ambulatory Visit (HOSPITAL_COMMUNITY)
Admission: RE | Admit: 2013-10-13 | Discharge: 2013-10-13 | Disposition: A | Discharge status: Home / Self Care | Payer: Medicare Other | Source: Ambulatory Visit | Attending: Internal Medicine | Admitting: Internal Medicine

## 2013-10-13 DIAGNOSIS — S14116A Complete lesion at C6 level of cervical spinal cord, initial encounter: Secondary | ICD-10-CM

## 2013-10-13 DIAGNOSIS — M949 Disorder of cartilage, unspecified: Principal | ICD-10-CM

## 2013-10-13 DIAGNOSIS — M899 Disorder of bone, unspecified: Secondary | ICD-10-CM | POA: Insufficient documentation

## 2013-10-13 MED ORDER — TECHNETIUM TC 99M MEDRONATE IV KIT
25.0000 | PACK | Freq: Once | INTRAVENOUS | Status: AC | PRN
  Administered 2013-10-13: 25 via INTRAVENOUS

## 2013-10-17 DIAGNOSIS — E7800 Pure hypercholesterolemia, unspecified: Secondary | ICD-10-CM | POA: Diagnosis not present

## 2013-10-17 DIAGNOSIS — I10 Essential (primary) hypertension: Secondary | ICD-10-CM | POA: Diagnosis not present

## 2013-10-17 DIAGNOSIS — G589 Mononeuropathy, unspecified: Secondary | ICD-10-CM | POA: Diagnosis not present

## 2013-10-24 ENCOUNTER — Ambulatory Visit (INDEPENDENT_AMBULATORY_CARE_PROVIDER_SITE_OTHER): Payer: Medicare Other | Admitting: Neurology

## 2013-10-24 ENCOUNTER — Encounter: Payer: Self-pay | Admitting: Neurology

## 2013-10-24 VITALS — BP 152/77 | HR 58 | Ht 61.0 in | Wt 154.0 lb

## 2013-10-24 DIAGNOSIS — B029 Zoster without complications: Secondary | ICD-10-CM

## 2013-10-24 NOTE — Progress Notes (Signed)
[facility]  PATIENT: Faith Ritter DOB: 11/26/1935  HISTORICAL Faith Ritter is a 78 years old right-handed Caucasian female, referred by her primary care physician Dr. Mignon Ritter for evaluation of bilateral upper and lower extremity paresthesia  She had a past medical history of hypertension, hyperlipidemia, I saw her previously in 2013 for bilateral feet feeling cold, it was essentially normal neurological examination then, there was no hyperreflexia found, she had a previous history of lower lumbar shingles in the past,  She suffered a right C2shingles in early January 2015, right side skull pain, rash at occipital region, this followed a urinary tract infection in November 2014, she was treated with antivirus medication for 2 weeks, with the onset of right skull rash, She also noticed numbness tingling of bilateral upper and lower extremities, starting from bilateral feet, travel to bilateral knee level, bilateral hands to bilateral elbow level.  Over the past few weeks, her symptoms has much improved, she only has mild discomfort at bilateral plantar feet, and her fingers, even at its worst, she denies gait difficulty, no bowel bladder incontinence, she was treated with gabapentin 300 mg 2 tablets 3 times a day, now on tapering dose, she also complains of frequent mild bilateral frontal headaches, she denies visual change.  UPDATE Feb 16th 2015: She is back to herself, no hands and feet paresthesia, no gait difficulty, she is back to exercise,   We have reviewed MRI together, MRI of the brain showed mild small vessel disease, MRI of cervical spine showed mild degenerative disc disease, no canal or foraminal stenosis   REVIEW OF SYSTEMS: Full 14 system review of systems performed and notable only for headache, numbness, anxious  ALLERGIES: Allergies  Allergen Reactions  . Ciprofloxacin Hcl   . Sulfa Antibiotics   . Penicillins Hives and Rash    HOME  MEDICATIONS: Outpatient Prescriptions Prior to Visit  Medication Sig Dispense Refill  . amLODipine (NORVASC) 5 MG tablet Take 5 mg by mouth daily.      Marland Kitchen aspirin 81 MG tablet Take 81 mg by mouth daily.      . busPIRone (BUSPAR) 7.5 MG tablet Take 7.5 mg by mouth daily.      . calcium citrate (CALCITRATE - DOSED IN MG ELEMENTAL CALCIUM) 950 MG tablet Take 200 mg of elemental calcium by mouth daily.      . carvedilol (COREG) 6.25 MG tablet Take 6.25 mg by mouth 2 (two) times daily with a meal.        . fenofibrate (TRICOR) 48 MG tablet Take 48 mg by mouth daily.        Marland Kitchen FLUoxetine (PROZAC) 10 MG capsule Take 10 mg by mouth daily.      Marland Kitchen gabapentin (NEURONTIN) 300 MG capsule Take 300 mg by mouth daily.      Marland Kitchen ibuprofen (ADVIL,MOTRIN) 200 MG tablet Take 400 mg by mouth every 6 (six) hours as needed.        . isosorbide-hydrALAZINE (BIDIL) 20-37.5 MG per tablet Take 1 tablet by mouth 3 (three) times daily.        Marland Kitchen l-methylfolate-B6-B12 (METANX) 3-35-2 MG TABS Take 1 tablet by mouth daily.      Marland Kitchen losartan-hydrochlorothiazide (HYZAAR) 100-25 MG per tablet Take 1 tablet by mouth daily.       . montelukast (SINGULAIR) 10 MG tablet Take 10 mg by mouth at bedtime.      . Multiple Vitamin (MULTIVITAMIN) capsule Take 1 capsule by mouth daily.      Marland Kitchen  nitrofurantoin, macrocrystal-monohydrate, (MACROBID) 100 MG capsule Take 100 capsules by mouth daily.      Marland Kitchen nystatin (MYCOSTATIN) 100000 UNIT/ML suspension       . olmesartan (BENICAR) 40 MG tablet Take 40 mg by mouth daily.      . valACYclovir (VALTREX) 1000 MG tablet 1 g daily.      . cephALEXin (KEFLEX) 500 MG capsule Take 500 mg by mouth daily.      Marland Kitchen doxycycline (DORYX) 100 MG DR capsule Take 100 mg by mouth 2 (two) times daily.      . fluconazole (DIFLUCAN) 100 MG tablet Take 100 mg by mouth daily.      Marland Kitchen HYDROcodone-acetaminophen (NORCO/VICODIN) 5-325 MG per tablet Take 5-325 tablets by mouth as needed.       No facility-administered medications  prior to visit.    PAST MEDICAL HISTORY: Past Medical History  Diagnosis Date  . Hypertension   . Hypercholesteremia     PAST SURGICAL HISTORY: Past Surgical History  Procedure Laterality Date  . Foot surgery Right   . Cataract extraction Bilateral     FAMILY HISTORY: Family History  Problem Relation Age of Onset  . Heart attack Father   . Stroke Mother     SOCIAL HISTORY:  History   Social History  . Marital Status: Divorced    Spouse Name: N/A    Number of Children: 57  . Years of Education: college   Occupational History    retired   Social History Main Topics  . Smoking status: Former Smoker    Types: Cigarettes  . Smokeless tobacco: Never Used  . Alcohol Use: No     Comment: occasionally  . Drug Use: No  . Sexual Activity: Not on file   Other Topics Concern  . Not on file   Social History Narrative   Patient lives in retirement community [facility] [place]).- 7  Years.   Retired.   Education- some college   Left handed.   Caffeine- Two cups daily.    PHYSICAL EXAM   Filed Vitals:   10/24/13 1137  BP: 152/77  Pulse: 58  Height: 5\' 1"  (1.549 m)  Weight: 154 lb (69.854 kg)    Not recorded    Body mass index is 29.11 kg/(m^2).   Generalized: In no acute distress  Neck: Supple, no carotid bruits   Cardiac: Regular rate rhythm  Pulmonary: Clear to auscultation bilaterally  Musculoskeletal: No deformity  Neurological examination  Mentation: Alert oriented to time, place, history taking, and causual conversation  Cranial nerve II-XII: Pupils were equal round reactive to light extraocular movements were full, Visual field were full on confrontational test. Bilateral fundi were sharp.  Facial sensation and strength were normal. Hearing was intact to finger rubbing bilaterally. Uvula tongue midline.  head turning and shoulder shrug and were normal and symmetric.Tongue protrusion into cheek strength was normal.  Motor: normal tone, bulk and  strength.  Sensory: Intact to fine touch, pinprick, preserved vibratory sensation, and proprioception at toes.  Coordination: Normal finger to nose, heel-to-shin bilaterally there was no truncal ataxia  Gait: Rising up from seated position without assistance, normal stance, without trunk ataxia, moderate stride, good arm swing, smooth turning, able to perform tiptoe, and heel walking without difficulty.   Romberg signs: Negative  Deep tendon reflexes: Brachioradialis 3/3, biceps 3/3, triceps 3/3, patellar 3/3, Achilles 2/2, plantar responses were flexor bilaterally.   DIAGNOSTIC DATA (LABS, IMAGING, TESTING) - I reviewed patient records, labs, notes, testing and imaging  myself where available.  Lab Results  Component Value Date   WBC 11.7* 08/01/2013   HGB 12.1 08/01/2013   HCT 35.4* 08/01/2013   MCV 91.2 08/01/2013   PLT 225 08/01/2013      Component Value Date/Time   NA 136 08/01/2013 1900   K 3.6 08/01/2013 1900   CL 101 08/01/2013 1900   CO2 22 08/01/2013 1900   GLUCOSE 106* 08/01/2013 1900   BUN 12 08/01/2013 1900   CREATININE 0.70 08/01/2013 1900   CALCIUM 9.2 08/01/2013 1900   PROT 6.7 08/01/2013 1900   ALBUMIN 3.5 08/01/2013 1900   AST 17 08/01/2013 1900   ALT 12 08/01/2013 1900   ALKPHOS 48 08/01/2013 1900   BILITOT 0.4 08/01/2013 1900   GFRNONAA 83* 08/01/2013 1900   GFRAA >90 08/01/2013 1900     ASSESSMENT AND PLAN   78 years old Caucasian female, with right C2 shingle, presented with bilaterally upper and lower extremity paresthesia, overall has improved, MRI of the brain, and cervical spine did not show any structural lesion, she is back to her baseline. She will only return to clinic as needed       Marcial Pacas, M.D. Ph.D.  [facility] [address], [place], [place] [address] [phone]

## 2013-11-02 ENCOUNTER — Ambulatory Visit: Payer: Medicare Other | Admitting: Neurology

## 2013-11-02 DIAGNOSIS — H26499 Other secondary cataract, unspecified eye: Secondary | ICD-10-CM | POA: Diagnosis not present

## 2013-11-28 DIAGNOSIS — E7800 Pure hypercholesterolemia, unspecified: Secondary | ICD-10-CM | POA: Diagnosis not present

## 2013-11-28 DIAGNOSIS — I10 Essential (primary) hypertension: Secondary | ICD-10-CM | POA: Diagnosis not present

## 2013-11-28 DIAGNOSIS — B029 Zoster without complications: Secondary | ICD-10-CM | POA: Diagnosis not present

## 2014-03-27 DIAGNOSIS — I10 Essential (primary) hypertension: Secondary | ICD-10-CM | POA: Diagnosis not present

## 2014-04-11 DIAGNOSIS — Z85820 Personal history of malignant melanoma of skin: Secondary | ICD-10-CM | POA: Diagnosis not present

## 2014-04-11 DIAGNOSIS — L819 Disorder of pigmentation, unspecified: Secondary | ICD-10-CM | POA: Diagnosis not present

## 2014-04-11 DIAGNOSIS — D235 Other benign neoplasm of skin of trunk: Secondary | ICD-10-CM | POA: Diagnosis not present

## 2014-04-11 DIAGNOSIS — L821 Other seborrheic keratosis: Secondary | ICD-10-CM | POA: Diagnosis not present

## 2014-04-24 DIAGNOSIS — E7800 Pure hypercholesterolemia, unspecified: Secondary | ICD-10-CM | POA: Diagnosis not present

## 2014-04-24 DIAGNOSIS — I10 Essential (primary) hypertension: Secondary | ICD-10-CM | POA: Diagnosis not present

## 2014-04-24 DIAGNOSIS — R7309 Other abnormal glucose: Secondary | ICD-10-CM | POA: Diagnosis not present

## 2014-06-15 ENCOUNTER — Emergency Department (HOSPITAL_BASED_OUTPATIENT_CLINIC_OR_DEPARTMENT_OTHER): Payer: Medicare Other

## 2014-06-15 ENCOUNTER — Emergency Department (HOSPITAL_BASED_OUTPATIENT_CLINIC_OR_DEPARTMENT_OTHER)
Admission: EM | Admit: 2014-06-15 | Discharge: 2014-06-15 | Disposition: A | Discharge status: Home / Self Care | Payer: Medicare Other | Attending: Emergency Medicine | Admitting: Emergency Medicine

## 2014-06-15 DIAGNOSIS — Z87891 Personal history of nicotine dependence: Secondary | ICD-10-CM | POA: Diagnosis not present

## 2014-06-15 DIAGNOSIS — R110 Nausea: Secondary | ICD-10-CM | POA: Diagnosis not present

## 2014-06-15 DIAGNOSIS — Z880 Allergy status to penicillin: Secondary | ICD-10-CM | POA: Insufficient documentation

## 2014-06-15 DIAGNOSIS — Z79899 Other long term (current) drug therapy: Secondary | ICD-10-CM | POA: Insufficient documentation

## 2014-06-15 DIAGNOSIS — Z8639 Personal history of other endocrine, nutritional and metabolic disease: Secondary | ICD-10-CM | POA: Diagnosis not present

## 2014-06-15 DIAGNOSIS — Z791 Long term (current) use of non-steroidal anti-inflammatories (NSAID): Secondary | ICD-10-CM | POA: Insufficient documentation

## 2014-06-15 DIAGNOSIS — R630 Anorexia: Secondary | ICD-10-CM | POA: Insufficient documentation

## 2014-06-15 DIAGNOSIS — R079 Chest pain, unspecified: Secondary | ICD-10-CM | POA: Diagnosis not present

## 2014-06-15 DIAGNOSIS — Z7982 Long term (current) use of aspirin: Secondary | ICD-10-CM | POA: Insufficient documentation

## 2014-06-15 DIAGNOSIS — R0789 Other chest pain: Secondary | ICD-10-CM | POA: Diagnosis not present

## 2014-06-15 DIAGNOSIS — Z792 Long term (current) use of antibiotics: Secondary | ICD-10-CM | POA: Insufficient documentation

## 2014-06-15 DIAGNOSIS — I10 Essential (primary) hypertension: Secondary | ICD-10-CM | POA: Insufficient documentation

## 2014-06-15 LAB — CBC WITH DIFFERENTIAL/PLATELET
BASOS PCT: 0 % (ref 0–1)
Basophils Absolute: 0 10*3/uL (ref 0.0–0.1)
EOS ABS: 0.1 10*3/uL (ref 0.0–0.7)
EOS PCT: 1 % (ref 0–5)
HCT: 36 % (ref 36.0–46.0)
HEMOGLOBIN: 12.4 g/dL (ref 12.0–15.0)
Lymphocytes Relative: 18 % (ref 12–46)
Lymphs Abs: 1.1 10*3/uL (ref 0.7–4.0)
MCH: 31.2 pg (ref 26.0–34.0)
MCHC: 34.4 g/dL (ref 30.0–36.0)
MCV: 90.5 fL (ref 78.0–100.0)
Monocytes Absolute: 0.8 10*3/uL (ref 0.1–1.0)
Monocytes Relative: 13 % — ABNORMAL HIGH (ref 3–12)
Neutro Abs: 4 10*3/uL (ref 1.7–7.7)
Neutrophils Relative %: 68 % (ref 43–77)
Platelets: 230 10*3/uL (ref 150–400)
RBC: 3.98 MIL/uL (ref 3.87–5.11)
RDW: 13.7 % (ref 11.5–15.5)
WBC: 5.9 10*3/uL (ref 4.0–10.5)

## 2014-06-15 LAB — BASIC METABOLIC PANEL
ANION GAP: 14 (ref 5–15)
BUN: 15 mg/dL (ref 6–23)
CHLORIDE: 97 meq/L (ref 96–112)
CO2: 25 meq/L (ref 19–32)
CREATININE: 0.9 mg/dL (ref 0.50–1.10)
Calcium: 9.5 mg/dL (ref 8.4–10.5)
GFR calc non Af Amer: 61 mL/min — ABNORMAL LOW (ref >90)
GFR, EST AFRICAN AMERICAN: 70 mL/min — AB (ref >90)
Glucose, Bld: 105 mg/dL — ABNORMAL HIGH (ref 70–99)
Potassium: 4.3 mEq/L (ref 3.7–5.3)
Sodium: 136 mEq/L — ABNORMAL LOW (ref 137–147)

## 2014-06-15 LAB — TROPONIN I
Troponin I: <0.3 ng/mL (ref <0.30)
Troponin I: <0.3 ng/mL (ref <0.30)

## 2014-06-15 MED ORDER — NITROGLYCERIN 0.4 MG SL SUBL
0.4000 mg | SUBLINGUAL_TABLET | SUBLINGUAL | Status: AC | PRN
  Administered 2014-06-15 (×3): 0.4 mg via SUBLINGUAL
  Filled 2014-06-15: qty 1

## 2014-06-15 MED ORDER — ACETAMINOPHEN 325 MG PO TABS
650.0000 mg | ORAL_TABLET | Freq: Once | ORAL | Status: AC
  Administered 2014-06-15: 650 mg via ORAL
  Filled 2014-06-15: qty 2

## 2014-06-15 MED ORDER — ASPIRIN 81 MG PO CHEW
324.0000 mg | CHEWABLE_TABLET | Freq: Once | ORAL | Status: AC
  Administered 2014-06-15: 324 mg via ORAL
  Filled 2014-06-15: qty 4

## 2014-06-15 NOTE — ED Provider Notes (Signed)
CSN: [id number]     Arrival date & time 06/15/14  1829 History   First MD Initiated Contact with Patient 06/15/14 0957     Chief Complaint  Patient presents with  . Chest Pain     (Consider location/radiation/quality/duration/timing/severity/associated sxs/prior Treatment) HPI Comments: 78 year-old female with shingles,  hypertension, cholesterol, mitral valve prolapse, follows with Dr. Nadyne Coombes presents with left-sided upper flank and chest pain. Constant since last night. No specific worsening or improving symptoms. Patient's had intermittent similar in the past. No heart attack or heart failure blood clot history. No recent surgeries or unilateral leg swelling leg pain, no active cancer. Symptoms are mild. He did not want any significant pain medicines. Patient has outpatient followup. No exertional symptoms or diaphoresis.  Patient is a 78 y.o. female presenting with chest pain. The history is provided by the patient.  Chest Pain Associated symptoms: no abdominal pain, no back pain, no fever, no headache, no shortness of breath and not vomiting     Past Medical History  Diagnosis Date  . Hypertension   . Hypercholesteremia    Past Surgical History  Procedure Laterality Date  . Foot surgery Right   . Cataract extraction Bilateral    Family History  Problem Relation Age of Onset  . Heart attack Father   . Stroke Mother    History  Substance Use Topics  . Smoking status: Former Smoker    Types: Cigarettes  . Smokeless tobacco: Never Used     Comment: Quit 30 years ago  . Alcohol Use: No     Comment: occasionally   OB History   Grav Para Term Preterm Abortions TAB SAB Ect Mult Living                 Review of Systems  Constitutional: Positive for appetite change. Negative for fever and chills.  HENT: Negative for congestion.   Eyes: Negative for visual disturbance.  Respiratory: Negative for shortness of breath.   Cardiovascular: Positive for chest pain.   Gastrointestinal: Negative for vomiting and abdominal pain.  Genitourinary: Negative for dysuria and flank pain.  Musculoskeletal: Negative for back pain, neck pain and neck stiffness.  Skin: Negative for rash.  Neurological: Negative for light-headedness and headaches.      Allergies  Ciprofloxacin hcl; Sulfa antibiotics; and Penicillins  Home Medications   Prior to Admission medications   Medication Sig Start Date End Date Taking? Authorizing Provider  aspirin 81 MG tablet Take 81 mg by mouth daily.   Yes Historical Provider, MD  calcium citrate (CALCITRATE - DOSED IN MG ELEMENTAL CALCIUM) 950 MG tablet Take 200 mg of elemental calcium by mouth daily.   Yes Historical Provider, MD  carvedilol (COREG) 6.25 MG tablet Take 6.25 mg by mouth 2 (two) times daily with a meal.     Yes Historical Provider, MD  fenofibrate (TRICOR) 48 MG tablet Take 48 mg by mouth daily.     Yes Historical Provider, MD  FLUoxetine (PROZAC) 10 MG capsule Take 10 mg by mouth daily.   Yes Historical Provider, MD  ibuprofen (ADVIL,MOTRIN) 200 MG tablet Take 400 mg by mouth every 6 (six) hours as needed.     Yes Historical Provider, MD  isosorbide-hydrALAZINE (BIDIL) 20-37.5 MG per tablet Take 1 tablet by mouth 3 (three) times daily.     Yes Historical Provider, MD  l-methylfolate-B6-B12 (METANX) 3-35-2 MG TABS Take 1 tablet by mouth daily.   Yes Historical Provider, MD  losartan-hydrochlorothiazide (HYZAAR) 100-25 MG per  tablet Take 1 tablet by mouth daily.    Yes Historical Provider, MD  montelukast (SINGULAIR) 10 MG tablet Take 10 mg by mouth at bedtime.   Yes Historical Provider, MD  Multiple Vitamin (MULTIVITAMIN) capsule Take 1 capsule by mouth daily.   Yes Historical Provider, MD  amLODipine (NORVASC) 5 MG tablet Take 5 mg by mouth daily.    Historical Provider, MD  busPIRone (BUSPAR) 7.5 MG tablet Take 7.5 mg by mouth daily. 08/29/13   Historical Provider, MD  gabapentin (NEURONTIN) 300 MG capsule Take 300  mg by mouth daily. 09/19/13   Historical Provider, MD  nitrofurantoin, macrocrystal-monohydrate, (MACROBID) 100 MG capsule Take 100 capsules by mouth daily. 08/02/13   Historical Provider, MD  nystatin (MYCOSTATIN) 100000 UNIT/ML suspension  07/13/13   Historical Provider, MD  olmesartan (BENICAR) 40 MG tablet Take 40 mg by mouth daily.    Historical Provider, MD  valACYclovir (VALTREX) 1000 MG tablet 1 g daily. 08/30/13   Historical Provider, MD   BP 170/69  Pulse 53  Temp(Src) 98.2 F (36.8 C) (Oral)  Resp 19  SpO2 100% Physical Exam  Nursing note and vitals reviewed. Constitutional: She is oriented to person, place, and time. She appears well-developed and well-nourished.  HENT:  Head: Normocephalic and atraumatic.  Eyes: Conjunctivae are normal. Right eye exhibits no discharge. Left eye exhibits no discharge.  Neck: Normal range of motion. Neck supple. No tracheal deviation present.  Cardiovascular: Normal rate, regular rhythm and intact distal pulses.   Pulmonary/Chest: Effort normal and breath sounds normal.  Abdominal: Soft. She exhibits no distension. There is no tenderness. There is no guarding.  Musculoskeletal: She exhibits no edema and no tenderness.  Neurological: She is alert and oriented to person, place, and time.  Skin: Skin is warm. No rash noted.  Psychiatric: She has a normal mood and affect.    ED Course  Procedures (including critical care time) Labs Review Labs Reviewed  BASIC METABOLIC PANEL - Abnormal; Notable for the following:    Sodium 136 (*)    Glucose, Bld 105 (*)    GFR calc non Af Amer 61 (*)    GFR calc Af Amer 70 (*)    All other components within normal limits  CBC WITH DIFFERENTIAL - Abnormal; Notable for the following:    Monocytes Relative 13 (*)    All other components within normal limits  TROPONIN I  TROPONIN I    Imaging Review Dg Chest 2 View  06/15/2014   CLINICAL DATA:  One day history of chest pain with nausea ; hypertension   EXAM: CHEST  2 VIEW  COMPARISON:  Chest radiograph and chest CT May 25, 2011  FINDINGS: There is no edema or consolidation. The heart size and pulmonary vascularity are normal. No adenopathy. There old healed rib fractures on the right. There is degenerative change in the thoracic spine.  IMPRESSION: No edema or consolidation.   Electronically Signed   By: Lowella Grip M.D.   On: 06/15/2014 10:43     EKG Interpretation   Date/Time:  Thursday June 15 2014 09:57:21 EDT Ventricular Rate:  54 PR Interval:  220 QRS Duration: 76 QT Interval:  404 QTC Calculation: 383 R Axis:   74 Text Interpretation:  Sinus bradycardia with 1st degree A-V block  Otherwise normal ECG Confirmed by Odilia Damico  MD, Florence Yeung ([id number]) on 06/15/2014  9:58:13 AM      MDM   Final diagnoses:  Left sided chest pain  HTN  Well-appearing  female with no known coronary artery disease, patient has 3 cardiac risk factors in with age coronary artery disease and differential. Presentation is atypical with mostly pain left lateral ribs not completely reproducible on exam. Plan for delta troponin, EKG reviewed no acute findings, chest x-ray reviewed no acute findings. On reassessment discussed results and discussed either observation/telemetry transfer to [facility] for further troponins and possible stress test versus: Your heart Dr. when she gets home today to schedule an appointment and possible stress test. Patient does not want, hospital, patient has capacity make decisions and prefers to followup outpatient with her heart Dr. Patient and family will return for any worsening or new symptoms.  Results and differential diagnosis were discussed with the patient/parent/guardian. Close follow up outpatient was discussed, comfortable with the plan.   Medications  aspirin chewable tablet 324 mg (324 mg Oral Given 06/15/14 1012)  acetaminophen (TYLENOL) tablet 650 mg (650 mg Oral Given 06/15/14 1115)  nitroGLYCERIN (NITROSTAT) SL  tablet 0.4 mg (0.4 mg Sublingual Given 06/15/14 1131)    Filed Vitals:   06/15/14 1300 06/15/14 1330 06/15/14 1400 06/15/14 1427  BP: 142/64 143/67 152/69 170/69  Pulse: 49 51 47 53  Temp:    98.2 F (36.8 C)  TempSrc:    Oral  Resp:    19  SpO2: 100% 99% 98% 100%    Final diagnoses:  Left sided chest pain        Mariea Clonts, MD 06/15/14 [phone]

## 2014-06-15 NOTE — Discharge Instructions (Signed)
Call your heart Dr. tomorrow to discuss further evaluation chest pain. Take your aspirin daily as directed. If you were given medicines take as directed.  If you are on coumadin or contraceptives realize their levels and effectiveness is altered by many different medicines.  If you have any reaction (rash, tongues swelling, other) to the medicines stop taking and see a physician.   Please follow up as directed and return to the ER or see a physician for new or worsening symptoms.  Thank you. Filed Vitals:   06/15/14 1300 06/15/14 1330 06/15/14 1400 06/15/14 1427  BP: 142/64 143/67 152/69 170/69  Pulse: 49 51 47 53  Temp:    98.2 F (36.8 C)  TempSrc:    Oral  Resp:    19  SpO2: 100% 99% 98% 100%    Chest Pain (Nonspecific) It is often hard to give a specific diagnosis for the cause of chest pain. There is always a chance that your pain could be related to something serious, such as a heart attack or a blood clot in the lungs. You need to follow up with your health care provider for further evaluation. CAUSES   Heartburn.  Pneumonia or bronchitis.  Anxiety or stress.  Inflammation around your heart (pericarditis) or lung (pleuritis or pleurisy).  A blood clot in the lung.  A collapsed lung (pneumothorax). It can develop suddenly on its own (spontaneous pneumothorax) or from trauma to the chest.  Shingles infection (herpes zoster virus). The chest wall is composed of bones, muscles, and cartilage. Any of these can be the source of the pain.  The bones can be bruised by injury.  The muscles or cartilage can be strained by coughing or overwork.  The cartilage can be affected by inflammation and become sore (costochondritis). DIAGNOSIS  Lab tests or other studies may be needed to find the cause of your pain. Your health care provider may have you take a test called an ambulatory electrocardiogram (ECG). An ECG records your heartbeat patterns over a 24-hour period. You may also have  other tests, such as:  Transthoracic echocardiogram (TTE). During echocardiography, sound waves are used to evaluate how blood flows through your heart.  Transesophageal echocardiogram (TEE).  Cardiac monitoring. This allows your health care provider to monitor your heart rate and rhythm in real time.  Holter monitor. This is a portable device that records your heartbeat and can help diagnose heart arrhythmias. It allows your health care provider to track your heart activity for several days, if needed.  Stress tests by exercise or by giving medicine that makes the heart beat faster. TREATMENT   Treatment depends on what may be causing your chest pain. Treatment may include:  Acid blockers for heartburn.  Anti-inflammatory medicine.  Pain medicine for inflammatory conditions.  Antibiotics if an infection is present.  You may be advised to change lifestyle habits. This includes stopping smoking and avoiding alcohol, caffeine, and chocolate.  You may be advised to keep your head raised (elevated) when sleeping. This reduces the chance of acid going backward from your stomach into your esophagus. Most of the time, nonspecific chest pain will improve within 2-3 days with rest and mild pain medicine.  HOME CARE INSTRUCTIONS   If antibiotics were prescribed, take them as directed. Finish them even if you start to feel better.  For the next few days, avoid physical activities that bring on chest pain. Continue physical activities as directed.  Do not use any tobacco products, including cigarettes, chewing  tobacco, or electronic cigarettes.  Avoid drinking alcohol.  Only take medicine as directed by your health care provider.  Follow your health care provider's suggestions for further testing if your chest pain does not go away.  Keep any follow-up appointments you made. If you do not go to an appointment, you could develop lasting (chronic) problems with pain. If there is any  problem keeping an appointment, call to reschedule. SEEK MEDICAL CARE IF:   Your chest pain does not go away, even after treatment.  You have a rash with blisters on your chest.  You have a fever. SEEK IMMEDIATE MEDICAL CARE IF:   You have increased chest pain or pain that spreads to your arm, neck, jaw, back, or abdomen.  You have shortness of breath.  You have an increasing cough, or you cough up blood.  You have severe back or abdominal pain.  You feel nauseous or vomit.  You have severe weakness.  You faint.  You have chills. This is an emergency. Do not wait to see if the pain will go away. Get medical help at once. Call your local emergency services (911 in [place] Do not drive yourself to the hospital. MAKE SURE YOU:   Understand these instructions.  Will watch your condition.  Will get help right away if you are not doing well or get worse. Document Released: 06/04/2005 Document Revised: 08/30/2013 Document Reviewed: 03/30/2008 [facility] Patient Information 2015 [place], [place]. This information is not intended to replace advice given to you by your health care provider. Make sure you discuss any questions you have with your health care provider.

## 2014-06-15 NOTE — ED Notes (Signed)
Reports left chest pain last night- pain returned this morning with nausea and feeling weak- also hx of heart murmur and mitral valve prolapse

## 2014-06-15 NOTE — ED Notes (Signed)
Patient transported to X-ray 

## 2014-06-26 DIAGNOSIS — R0782 Intercostal pain: Secondary | ICD-10-CM | POA: Diagnosis not present

## 2014-07-21 DIAGNOSIS — R350 Frequency of micturition: Secondary | ICD-10-CM | POA: Diagnosis not present

## 2014-08-08 DIAGNOSIS — J329 Chronic sinusitis, unspecified: Secondary | ICD-10-CM | POA: Diagnosis not present

## 2014-08-21 DIAGNOSIS — R739 Hyperglycemia, unspecified: Secondary | ICD-10-CM | POA: Diagnosis not present

## 2014-08-21 DIAGNOSIS — I10 Essential (primary) hypertension: Secondary | ICD-10-CM | POA: Diagnosis not present

## 2014-08-24 DIAGNOSIS — R0981 Nasal congestion: Secondary | ICD-10-CM | POA: Diagnosis not present

## 2014-08-24 DIAGNOSIS — I10 Essential (primary) hypertension: Secondary | ICD-10-CM | POA: Diagnosis not present

## 2014-08-24 DIAGNOSIS — R739 Hyperglycemia, unspecified: Secondary | ICD-10-CM | POA: Diagnosis not present

## 2014-08-24 DIAGNOSIS — E780 Pure hypercholesterolemia: Secondary | ICD-10-CM | POA: Diagnosis not present

## 2014-10-17 DIAGNOSIS — Z08 Encounter for follow-up examination after completed treatment for malignant neoplasm: Secondary | ICD-10-CM | POA: Diagnosis not present

## 2014-10-17 DIAGNOSIS — L821 Other seborrheic keratosis: Secondary | ICD-10-CM | POA: Diagnosis not present

## 2014-10-17 DIAGNOSIS — Z85820 Personal history of malignant melanoma of skin: Secondary | ICD-10-CM | POA: Diagnosis not present

## 2014-10-17 DIAGNOSIS — L814 Other melanin hyperpigmentation: Secondary | ICD-10-CM | POA: Diagnosis not present

## 2014-11-10 DIAGNOSIS — H26493 Other secondary cataract, bilateral: Secondary | ICD-10-CM | POA: Diagnosis not present

## 2014-11-20 DIAGNOSIS — I10 Essential (primary) hypertension: Secondary | ICD-10-CM | POA: Diagnosis not present

## 2014-12-05 DIAGNOSIS — E780 Pure hypercholesterolemia: Secondary | ICD-10-CM | POA: Diagnosis not present

## 2014-12-05 DIAGNOSIS — I10 Essential (primary) hypertension: Secondary | ICD-10-CM | POA: Diagnosis not present

## 2014-12-05 DIAGNOSIS — E785 Hyperlipidemia, unspecified: Secondary | ICD-10-CM | POA: Diagnosis not present

## 2014-12-05 DIAGNOSIS — M7121 Synovial cyst of popliteal space [Baker], right knee: Secondary | ICD-10-CM | POA: Diagnosis not present

## 2014-12-05 DIAGNOSIS — M179 Osteoarthritis of knee, unspecified: Secondary | ICD-10-CM | POA: Diagnosis not present

## 2014-12-05 DIAGNOSIS — M25561 Pain in right knee: Secondary | ICD-10-CM | POA: Diagnosis not present

## 2015-01-23 DIAGNOSIS — M7121 Synovial cyst of popliteal space [Baker], right knee: Secondary | ICD-10-CM | POA: Diagnosis not present

## 2015-02-08 DIAGNOSIS — L821 Other seborrheic keratosis: Secondary | ICD-10-CM | POA: Diagnosis not present

## 2015-03-14 DIAGNOSIS — E780 Pure hypercholesterolemia: Secondary | ICD-10-CM | POA: Diagnosis not present

## 2015-03-14 DIAGNOSIS — I341 Nonrheumatic mitral (valve) prolapse: Secondary | ICD-10-CM | POA: Diagnosis not present

## 2015-03-14 DIAGNOSIS — R079 Chest pain, unspecified: Secondary | ICD-10-CM | POA: Diagnosis not present

## 2015-03-14 DIAGNOSIS — R0981 Nasal congestion: Secondary | ICD-10-CM | POA: Diagnosis not present

## 2015-03-21 DIAGNOSIS — R7309 Other abnormal glucose: Secondary | ICD-10-CM | POA: Diagnosis not present

## 2015-03-21 DIAGNOSIS — M7120 Synovial cyst of popliteal space [Baker], unspecified knee: Secondary | ICD-10-CM | POA: Diagnosis not present

## 2015-03-21 DIAGNOSIS — I10 Essential (primary) hypertension: Secondary | ICD-10-CM | POA: Diagnosis not present

## 2015-03-21 DIAGNOSIS — E780 Pure hypercholesterolemia: Secondary | ICD-10-CM | POA: Diagnosis not present

## 2015-04-17 DIAGNOSIS — D225 Melanocytic nevi of trunk: Secondary | ICD-10-CM | POA: Diagnosis not present

## 2015-04-17 DIAGNOSIS — Z85820 Personal history of malignant melanoma of skin: Secondary | ICD-10-CM | POA: Diagnosis not present

## 2015-04-17 DIAGNOSIS — Z08 Encounter for follow-up examination after completed treatment for malignant neoplasm: Secondary | ICD-10-CM | POA: Diagnosis not present

## 2015-04-17 DIAGNOSIS — L814 Other melanin hyperpigmentation: Secondary | ICD-10-CM | POA: Diagnosis not present

## 2015-04-24 DIAGNOSIS — M1711 Unilateral primary osteoarthritis, right knee: Secondary | ICD-10-CM | POA: Diagnosis not present

## 2015-08-09 ENCOUNTER — Encounter (HOSPITAL_BASED_OUTPATIENT_CLINIC_OR_DEPARTMENT_OTHER): Payer: Self-pay | Admitting: *Deleted

## 2015-08-09 ENCOUNTER — Emergency Department (HOSPITAL_BASED_OUTPATIENT_CLINIC_OR_DEPARTMENT_OTHER)
Admission: EM | Admit: 2015-08-09 | Discharge: 2015-08-10 | Disposition: A | Discharge status: Home / Self Care | Payer: Medicare Other | Attending: Emergency Medicine | Admitting: Emergency Medicine

## 2015-08-09 ENCOUNTER — Emergency Department (HOSPITAL_BASED_OUTPATIENT_CLINIC_OR_DEPARTMENT_OTHER): Payer: Medicare Other

## 2015-08-09 DIAGNOSIS — M25561 Pain in right knee: Secondary | ICD-10-CM | POA: Insufficient documentation

## 2015-08-09 DIAGNOSIS — Z7982 Long term (current) use of aspirin: Secondary | ICD-10-CM | POA: Diagnosis not present

## 2015-08-09 DIAGNOSIS — E7800 Pure hypercholesterolemia, unspecified: Secondary | ICD-10-CM | POA: Diagnosis not present

## 2015-08-09 DIAGNOSIS — I10 Essential (primary) hypertension: Secondary | ICD-10-CM | POA: Diagnosis not present

## 2015-08-09 DIAGNOSIS — M25461 Effusion, right knee: Secondary | ICD-10-CM | POA: Diagnosis not present

## 2015-08-09 DIAGNOSIS — Z79899 Other long term (current) drug therapy: Secondary | ICD-10-CM | POA: Insufficient documentation

## 2015-08-09 DIAGNOSIS — Z880 Allergy status to penicillin: Secondary | ICD-10-CM | POA: Insufficient documentation

## 2015-08-09 DIAGNOSIS — Z87891 Personal history of nicotine dependence: Secondary | ICD-10-CM | POA: Diagnosis not present

## 2015-08-09 MED ORDER — IBUPROFEN 600 MG PO TABS: 600.0000 mg | ORAL_TABLET | Freq: Four times a day (QID) | ORAL | Status: DC | PRN

## 2015-08-09 MED ORDER — ACETAMINOPHEN-CODEINE #3 300-30 MG PO TABS: 1.0000 | ORAL_TABLET | Freq: Four times a day (QID) | ORAL | Status: DC | PRN

## 2015-08-09 MED ORDER — ACETAMINOPHEN-CODEINE #3 300-30 MG PO TABS
1.0000 | ORAL_TABLET | Freq: Once | ORAL | Status: AC
  Administered 2015-08-09: 1 via ORAL
  Filled 2015-08-09: qty 1

## 2015-08-09 MED ORDER — IBUPROFEN 200 MG PO TABS
600.0000 mg | ORAL_TABLET | Freq: Once | ORAL | Status: AC
  Administered 2015-08-09: 600 mg via ORAL
  Filled 2015-08-09: qty 1

## 2015-08-09 NOTE — Discharge Instructions (Signed)
You were seen for your knee pain today.  At this time it seems to be related to arthritis and use of the knee causing irritation.  You need to follow up with your orthopedic doctor as soon as possible.  Ice, elevate, rest the knee, and take the medications as prescribed.  Return if you are unable to bend/straighten or move your knee or if you develop fever or other symptoms of being sick generally.  Knee Pain Knee pain is a very common symptom and can have many causes. Knee pain often goes away when you follow your health care provider's instructions for relieving pain and discomfort at home. However, knee pain can develop into a condition that needs treatment. Some conditions may include:  Arthritis caused by wear and tear (osteoarthritis).  Arthritis caused by swelling and irritation (rheumatoid arthritis or gout).  A cyst or growth in your knee.  An infection in your knee joint.  An injury that will not heal.  Damage, swelling, or irritation of the tissues that support your knee (torn ligaments or tendinitis). If your knee pain continues, additional tests may be ordered to diagnose your condition. Tests may include X-rays or other imaging studies of your knee. You may also need to have fluid removed from your knee. Treatment for ongoing knee pain depends on the cause, but treatment may include:  Medicines to relieve pain or swelling.  Steroid injections in your knee.  Physical therapy.  Surgery. HOME CARE INSTRUCTIONS  Take medicines only as directed by your health care provider.  Rest your knee and keep it raised (elevated) while you are resting.  Do not do things that cause or worsen pain.  Avoid high-impact activities or exercises, such as running, jumping rope, or doing jumping jacks.  Apply ice to the knee area:  Put ice in a plastic bag.  Place a towel between your skin and the bag.  Leave the ice on for 20 minutes, 2-3 times a day.  Ask your health care provider  if you should wear an elastic knee support.  Keep a pillow under your knee when you sleep.  Lose weight if you are overweight. Extra weight can put pressure on your knee.  Do not use any tobacco products, including cigarettes, chewing tobacco, or electronic cigarettes. If you need help quitting, ask your health care provider. Smoking may slow the healing of any bone and joint problems that you may have. SEEK MEDICAL CARE IF:  Your knee pain continues, changes, or gets worse.  You have a fever along with knee pain.  Your knee buckles or locks up.  Your knee becomes more swollen. SEEK IMMEDIATE MEDICAL CARE IF:   Your knee joint feels hot to the touch.  You have chest pain or trouble breathing.   This information is not intended to replace advice given to you by your health care provider. Make sure you discuss any questions you have with your health care provider.   Document Released: 06/22/2007 Document Revised: 09/15/2014 Document Reviewed: 04/10/2014 Elsevier Interactive Patient Education [employer].

## 2015-08-09 NOTE — ED Provider Notes (Signed)
CSN: [id number]     Arrival date & time 08/09/15  1848 History   First MD Initiated Contact with Patient 08/09/15 2046     Chief Complaint  Patient presents with  . Knee Pain     (Consider location/radiation/quality/duration/timing/severity/associated sxs/prior Treatment) Patient is a 79 y.o. female presenting with knee pain.  Knee Pain Associated symptoms: no fever     Patient is a 79 year old female who who presents to the ED with complaint of right knee pain. Patient reports she was seen for similar knee pain and swelling in March 2016 in [place] where she was diagnosed with a Baker's cyst. Since then she notes she has had multiple steroid injections into her right knee for pain by orthopedist, last injection was in August. Patient notes she has been having intermittent aching pain to her right knee over the past few weeks but noticed the pain worsened today with associated swelling. Denies any recent fall, trauma, injury. Patient notes she has been taking Advil at home with no relief. She notes she has been using ice with mild relief. Patient reports pain is worse with walking. Denies numbness, tingling, weakness.  Past Medical History  Diagnosis Date  . Hypertension   . Hypercholesteremia    Past Surgical History  Procedure Laterality Date  . Foot surgery Right   . Cataract extraction Bilateral    Family History  Problem Relation Age of Onset  . Heart attack Father   . Stroke Mother    Social History  Substance Use Topics  . Smoking status: Former Smoker    Types: Cigarettes  . Smokeless tobacco: Never Used     Comment: Quit 30 years ago  . Alcohol Use: No     Comment: occasionally   OB History    No data available     Review of Systems  Constitutional: Negative for fever.  Musculoskeletal: Positive for joint swelling and arthralgias.  Skin: Negative for wound.  Neurological: Negative for weakness and numbness.      Allergies  Ciprofloxacin hcl; Sulfa  antibiotics; and Penicillins  Home Medications   Prior to Admission medications   Medication Sig Start Date End Date Taking? Authorizing Provider  acetaminophen-codeine (TYLENOL #3) 300-30 MG tablet Take 1 tablet by mouth every 6 (six) hours as needed for moderate pain or severe pain. 08/09/15   Harvel Quale, MD  amLODipine (NORVASC) 5 MG tablet Take 5 mg by mouth daily.    Historical Provider, MD  aspirin 81 MG tablet Take 81 mg by mouth daily.    Historical Provider, MD  busPIRone (BUSPAR) 7.5 MG tablet Take 7.5 mg by mouth daily. 08/29/13   Historical Provider, MD  calcium citrate (CALCITRATE - DOSED IN MG ELEMENTAL CALCIUM) 950 MG tablet Take 200 mg of elemental calcium by mouth daily.    Historical Provider, MD  carvedilol (COREG) 6.25 MG tablet Take 6.25 mg by mouth 2 (two) times daily with a meal.      Historical Provider, MD  fenofibrate (TRICOR) 48 MG tablet Take 48 mg by mouth daily.      Historical Provider, MD  FLUoxetine (PROZAC) 10 MG capsule Take 10 mg by mouth daily.    Historical Provider, MD  gabapentin (NEURONTIN) 300 MG capsule Take 300 mg by mouth daily. 09/19/13   Historical Provider, MD  ibuprofen (ADVIL,MOTRIN) 600 MG tablet Take 1 tablet (600 mg total) by mouth every 6 (six) hours as needed for moderate pain. 08/09/15   Harvel Quale, MD  isosorbide-hydrALAZINE (BIDIL) 20-37.5 MG per tablet Take 1 tablet by mouth 3 (three) times daily.      Historical Provider, MD  l-methylfolate-B6-B12 (METANX) 3-35-2 MG TABS Take 1 tablet by mouth daily.    Historical Provider, MD  losartan-hydrochlorothiazide (HYZAAR) 100-25 MG per tablet Take 1 tablet by mouth daily.     Historical Provider, MD  montelukast (SINGULAIR) 10 MG tablet Take 10 mg by mouth at bedtime.    Historical Provider, MD  Multiple Vitamin (MULTIVITAMIN) capsule Take 1 capsule by mouth daily.    Historical Provider, MD  nitrofurantoin, macrocrystal-monohydrate, (MACROBID) 100 MG capsule Take 100 capsules by  mouth daily. 08/02/13   Historical Provider, MD  nystatin (MYCOSTATIN) 100000 UNIT/ML suspension  07/13/13   Historical Provider, MD  olmesartan (BENICAR) 40 MG tablet Take 40 mg by mouth daily.    Historical Provider, MD  valACYclovir (VALTREX) 1000 MG tablet 1 g daily. 08/30/13   Historical Provider, MD   BP 190/78 mmHg  Pulse 71  Temp(Src) 98.6 F (37 C) (Oral)  Resp 18  Ht 5\' 3"  (1.6 m)  Wt 70.308 kg  BMI 27.46 kg/m2  SpO2 98% Physical Exam  Constitutional: She is oriented to person, place, and time. She appears well-developed and well-nourished. No distress.  HENT:  Head: Normocephalic and atraumatic.  Eyes: Conjunctivae and EOM are normal. Right eye exhibits no discharge. Left eye exhibits no discharge. No scleral icterus.  Neck: Normal range of motion. Neck supple.  Cardiovascular: Normal rate.   Pulmonary/Chest: Effort normal.  Musculoskeletal: Normal range of motion. She exhibits edema and tenderness.       Right knee: She exhibits swelling and bony tenderness. She exhibits normal range of motion, no effusion, no ecchymosis, no deformity, no laceration, no erythema, normal alignment, no LCL laxity, normal patellar mobility, normal meniscus and no MCL laxity. Tenderness found.  TTP to anterior and posterior right knee, mild swelling noted to anterior knee. Full active range of motion of right knee, sensation intact,5/5 strength, patellar reflex intact. No surrounding erythema or wounds noted. 2+ DP pulses. Full range of motion of right hip ankle and foot. Cap refill less than 2.  Neurological: She is alert and oriented to person, place, and time.  Nursing note and vitals reviewed.   ED Course  Procedures (including critical care time) Labs Review Labs Reviewed - No data to display  Imaging Review Dg Knee Complete 4 Views Right  08/09/2015  CLINICAL DATA:  Posterior right knee pain and swelling. EXAM: RIGHT KNEE - COMPLETE 4+ VIEW COMPARISON:  None. FINDINGS: Negative for  acute fracture or dislocation. Moderately severe arthritis is present in the medial compartment. Faint chondrocalcinosis is visible on the lateral compartment. Small joint effusion. No bone lesion or bony destruction. IMPRESSION: Chondrocalcinosis.  Medial compartment arthritis.  Joint effusion. Electronically Signed   By: Andreas Newport M.D.   On: 08/09/2015 22:09   I have personally reviewed and evaluated these images and lab results as part of my medical decision-making.  Filed Vitals:   08/09/15 1851 08/09/15 2324  BP: 191/90 190/78  Pulse: 74 71  Temp: 98.4 F (36.9 C) 98.6 F (37 C)  Resp: 16 18     MDM   Final diagnoses:  Right knee pain    Patient presents with right knee pain and swelling. History of Baker's cyst. Denies any known fall, trauma, injury. Exam revealed mild swelling to right knee, full range of motion of right knee, right lower extremity otherwise neurovascularly intact. Pt  is able ambulate, no bony abnormality or deformity, no erythema or excessive heat, no evidence of cellulitis, DVT, or septic joint. Right knee xray revealed chondrocalcinosis, medial compartment arthritis and joint effusion.   Dr. Alfonse Spruce evaluated pt. Plan to d/c pt home with pain management, however due to new onset of swelling and worsening pain will consult ortho regarding management/outpatient follow up.  Hand-off to Dr. Alfonse Spruce, ortho consult pending.    Chesley Noon [place], [place] 08/11/15 [id number]  Harvel Quale, MD 08/11/15 1038

## 2015-08-09 NOTE — ED Notes (Signed)
Pain behind her right knee. Hx of bakers cyst.

## 2015-08-09 NOTE — ED Notes (Signed)
Patient given ice pack for knee at this time. Informed of wait.

## 2015-08-10 DIAGNOSIS — M25461 Effusion, right knee: Secondary | ICD-10-CM | POA: Diagnosis not present

## 2015-08-10 DIAGNOSIS — M25561 Pain in right knee: Secondary | ICD-10-CM | POA: Diagnosis not present

## 2015-08-10 DIAGNOSIS — M1711 Unilateral primary osteoarthritis, right knee: Secondary | ICD-10-CM | POA: Diagnosis not present

## 2015-08-24 DIAGNOSIS — M1711 Unilateral primary osteoarthritis, right knee: Secondary | ICD-10-CM | POA: Diagnosis not present

## 2015-09-20 DIAGNOSIS — G589 Mononeuropathy, unspecified: Secondary | ICD-10-CM | POA: Diagnosis not present

## 2015-09-20 DIAGNOSIS — R739 Hyperglycemia, unspecified: Secondary | ICD-10-CM | POA: Diagnosis not present

## 2015-09-20 DIAGNOSIS — I10 Essential (primary) hypertension: Secondary | ICD-10-CM | POA: Diagnosis not present

## 2015-09-20 DIAGNOSIS — E7800 Pure hypercholesterolemia, unspecified: Secondary | ICD-10-CM | POA: Diagnosis not present

## 2015-09-22 ENCOUNTER — Ambulatory Visit (INDEPENDENT_AMBULATORY_CARE_PROVIDER_SITE_OTHER): Payer: Medicare Other | Admitting: Physician Assistant

## 2015-09-22 VITALS — BP 146/80 | HR 78 | Temp 98.2°F | Resp 16 | Ht 64.0 in | Wt 168.8 lb

## 2015-09-22 DIAGNOSIS — R300 Dysuria: Secondary | ICD-10-CM

## 2015-09-22 LAB — POC MICROSCOPIC URINALYSIS (UMFC): MUCUS RE: ABSENT

## 2015-09-22 LAB — POCT URINALYSIS DIP (MANUAL ENTRY)
Bilirubin, UA: NEGATIVE
Glucose, UA: NEGATIVE
Ketones, POC UA: NEGATIVE
NITRITE UA: NEGATIVE
PH UA: 8
Spec Grav, UA: 1.015
UROBILINOGEN UA: 0.2

## 2015-09-22 MED ORDER — NITROFURANTOIN MACROCRYSTAL 100 MG PO CAPS: 100.0000 mg | ORAL_CAPSULE | Freq: Four times a day (QID) | ORAL | Status: AC

## 2015-09-22 NOTE — Progress Notes (Signed)
Subjective:    Patient ID: Faith Ritter, female    DOB: October 22, 1935, 80 y.o.   MRN: [id number]  Chief Complaint  Patient presents with  . Dysuria    x 1 day  . Urinary Frequency    x 1 day  . Hematuria    x 1 day   HPI Patient presents today for a one day history of Dysuria and increased urinary frequency. On Thursday, she was in to see her Primary Care, Dr. Maudie Mercury, for blood work. At the time of visit she was unable to produce a urine sample so she was told to collect her urine sample at home. She believes she was unable to produce urine because she was fasting. Friday morning she noticed a burning sensation with urination. She took 2 cranberry pills to relieve her symptoms, but noted no relief. She reports 3 episodes of nocturia last night which is unusual for her. On Thursday she states her urine was normal, but this morning she noticed blood in her urine. She denies any odor with her urination. Associated symptoms include new onset abdominal fullness, urgency and retention. She denies fever, N/V/D/C, flank pain, or vaginal d/c.  Review of Systems  Constitutional: Negative for fever.  Respiratory: Negative for shortness of breath.   Cardiovascular: Negative for chest pain.  Gastrointestinal: Positive for abdominal pain (uncomfortable fullness. ). Negative for nausea, vomiting, diarrhea, constipation and blood in stool.  Genitourinary: Positive for dysuria, urgency (and retention), frequency and hematuria. Negative for flank pain, vaginal discharge, difficulty urinating and vaginal pain.  Musculoskeletal: Negative for back pain.   Allergies  Allergen Reactions  . Ciprofloxacin Hcl     "makes hands and feet cold"  . Sulfa Antibiotics Hives  . Penicillins Hives and Rash   Prior to Admission medications   Medication Sig Start Date End Date Taking? Authorizing Provider  aspirin 81 MG tablet Take 81 mg by mouth daily.   Yes Historical Provider, MD  calcium citrate (CALCITRATE - DOSED  IN MG ELEMENTAL CALCIUM) 950 MG tablet Take 200 mg of elemental calcium by mouth daily.   Yes Historical Provider, MD  carvedilol (COREG) 6.25 MG tablet Take 6.25 mg by mouth 2 (two) times daily with a meal.     Yes Historical Provider, MD  fenofibrate (TRICOR) 48 MG tablet Take 48 mg by mouth daily. Reported on 09/22/2015   Yes Historical Provider, MD  isosorbide-hydrALAZINE (BIDIL) 20-37.5 MG per tablet Take 1 tablet by mouth 3 (three) times daily.     Yes Historical Provider, MD  l-methylfolate-B6-B12 (METANX) 3-35-2 MG TABS Take 1 tablet by mouth daily.   Yes Historical Provider, MD  losartan-hydrochlorothiazide (HYZAAR) 100-25 MG per tablet Take 1 tablet by mouth daily.    Yes Historical Provider, MD  montelukast (SINGULAIR) 10 MG tablet Take 10 mg by mouth at bedtime.   Yes Historical Provider, MD  Multiple Vitamin (MULTIVITAMIN) capsule Take 1 capsule by mouth daily.   Yes Historical Provider, MD  acetaminophen-codeine (TYLENOL #3) 300-30 MG tablet Take 1 tablet by mouth every 6 (six) hours as needed for moderate pain or severe pain. Patient not taking: Reported on 09/22/2015 08/09/15   Harvel Quale, MD  amLODipine (NORVASC) 5 MG tablet Take 5 mg by mouth daily. Reported on 09/22/2015    Historical Provider, MD  busPIRone (BUSPAR) 7.5 MG tablet Take 7.5 mg by mouth daily. Reported on 09/22/2015 08/29/13   Historical Provider, MD  FLUoxetine (PROZAC) 10 MG capsule Take 10 mg by  mouth daily. Reported on 09/22/2015    Historical Provider, MD  gabapentin (NEURONTIN) 300 MG capsule Take 300 mg by mouth daily. Reported on 09/22/2015 09/19/13   Historical Provider, MD  ibuprofen (ADVIL,MOTRIN) 600 MG tablet Take 1 tablet (600 mg total) by mouth every 6 (six) hours as needed for moderate pain. Patient not taking: Reported on 09/22/2015 08/09/15   Harvel Quale, MD  nitrofurantoin, macrocrystal-monohydrate, (MACROBID) 100 MG capsule Take 100 capsules by mouth daily. Reported on 09/22/2015 08/02/13    Historical Provider, MD  nystatin (MYCOSTATIN) 100000 UNIT/ML suspension Reported on 09/22/2015 07/13/13   Historical Provider, MD  olmesartan (BENICAR) 40 MG tablet Take 40 mg by mouth daily. Reported on 09/22/2015    Historical Provider, MD  valACYclovir (VALTREX) 1000 MG tablet 1 g daily. Reported on 09/22/2015 08/30/13   Historical Provider, MD   Patient Active Problem List   Diagnosis Date Noted  . Cervical myelopathy ([place]) 10/03/2013  . Shingles 10/03/2013       Objective:   Physical Exam  Constitutional: She appears well-developed and well-nourished.  BP 148/80 mmHg  Pulse 78  Temp(Src) 98.2 F (36.8 C) (Oral)  Resp 16  Ht 5\' 4"  (1.626 m)  Wt 168 lb 12.8 oz (76.567 kg)  BMI 28.96 kg/m2  SpO2 97%  HENT:  Head: Normocephalic and atraumatic.  Neck: Neck supple.  Cardiovascular: Normal rate and regular rhythm.   Murmur (Soft systolic) heard. Pulmonary/Chest: Effort normal and breath sounds normal.  Abdominal: Soft. Normal appearance, normal aorta and bowel sounds are normal. She exhibits no shifting dullness, no distension, no abdominal bruit, no ascites and no mass. There is no hepatosplenomegaly. There is tenderness in the suprapubic area. There is no rebound and no CVA tenderness. No hernia.  Lymphadenopathy:       Head (right side): No submental, no submandibular, no tonsillar, no preauricular, no posterior auricular and no occipital adenopathy present.       Head (left side): No submental, no submandibular, no tonsillar, no preauricular, no posterior auricular and no occipital adenopathy present.    She has no cervical adenopathy.  Neurological: She is alert.  Skin: Skin is warm and dry.  Psychiatric: She has a normal mood and affect. Her behavior is normal.  Vitals reviewed.  Results for orders placed or performed in visit on 09/22/15  POCT Microscopic Urinalysis ([facility])  Result Value Ref Range   WBC,UR,HPF,POC Moderate (A) None WBC/hpf   RBC,UR,HPF,POC Too numerous to  count  (A) None RBC/hpf   Bacteria None None, Too numerous to count   Mucus Absent Absent   Epithelial Cells, UR Per Microscopy Few (A) None, Too numerous to count cells/hpf  POCT urinalysis dipstick  Result Value Ref Range   Color, UA brown (A) yellow   Clarity, UA cloudy (A) clear   Glucose, UA negative negative   Bilirubin, UA negative negative   Ketones, POC UA negative negative   Spec Grav, UA 1.015    Blood, UA large (A) negative   pH, UA 8.0    Protein Ur, POC =100 (A) negative   Urobilinogen, UA 0.2    Nitrite, UA Negative Negative   Leukocytes, UA small (1+) (A) Negative      Assessment & Plan:  1. Dysuria - POCT Microscopic Urinalysis ([facility]) - POCT urinalysis dipstick - Urine culture - nitrofurantoin (MACRODANTIN) 100 MG capsule; Take 1 capsule (100 mg total) by mouth 4 (four) times daily.  Dispense: 14 capsule; Refill: 0 -Symptoms and labs most consistent  with diagnosis of a UTI.  Return if symptoms worsen or fail to improve.

## 2015-09-22 NOTE — Progress Notes (Signed)
Subjective:   Patient ID: Faith Ritter, female     DOB: 05-05-1936, 80 y.o.    MRN: [id number]  PCP: Jani Gravel, MD  Chief Complaint  Patient presents with  . Dysuria    x 1 day  . Urinary Frequency    x 1 day  . Hematuria    x 1 day    HPI  Presents for evaluation of a one day history of Dysuria and increased urinary frequency. On Thursday, 09/20/15 she was in for blood work in preparation for her upcoming visit with Dr. Maudie Mercury. At the time of visit she was unable to produce a urine sample so she was told to collect her urine sample at home. She believes she was unable to produce urine because she was fasting.   Friday (yesterday) morning she noticed a burning sensation with urination. She took 2 cranberry pills to relieve her symptoms, but noted no relief. She reports 3 episodes of nocturia last night which is unusual for her. On Thursday she states her urine was normal, but this morning she noticed blood in her urine. She denies any odor with her urination. Associated symptoms include new onset abdominal fullness, urgency and retention. She denies fever, N/V/D/C, flank pain, or vaginal d/c.    Prior to Admission medications   Medication Sig Start Date End Date Taking? Authorizing Provider  aspirin 81 MG tablet Take 81 mg by mouth daily.   Yes Historical Provider, MD  calcium citrate (CALCITRATE - DOSED IN MG ELEMENTAL CALCIUM) 950 MG tablet Take 200 mg of elemental calcium by mouth daily.   Yes Historical Provider, MD  carvedilol (COREG) 6.25 MG tablet Take 6.25 mg by mouth 2 (two) times daily with a meal.     Yes Historical Provider, MD  fenofibrate (TRICOR) 48 MG tablet Take 48 mg by mouth daily. Reported on 09/22/2015   Yes Historical Provider, MD  isosorbide-hydrALAZINE (BIDIL) 20-37.5 MG per tablet Take 1 tablet by mouth 3 (three) times daily.     Yes Historical Provider, MD  l-methylfolate-B6-B12 (METANX) 3-35-2 MG TABS Take 1 tablet by mouth daily.   Yes Historical Provider,  MD  losartan-hydrochlorothiazide (HYZAAR) 100-25 MG per tablet Take 1 tablet by mouth daily.    Yes Historical Provider, MD  montelukast (SINGULAIR) 10 MG tablet Take 10 mg by mouth at bedtime.   Yes Historical Provider, MD  Multiple Vitamin (MULTIVITAMIN) capsule Take 1 capsule by mouth daily.   Yes Historical Provider, MD  acetaminophen-codeine (TYLENOL #3) 300-30 MG tablet Take 1 tablet by mouth every 6 (six) hours as needed for moderate pain or severe pain. Patient not taking: Reported on 09/22/2015 08/09/15   Harvel Quale, MD  amLODipine (NORVASC) 5 MG tablet Take 5 mg by mouth daily. Reported on 09/22/2015    Historical Provider, MD  busPIRone (BUSPAR) 7.5 MG tablet Take 7.5 mg by mouth daily. Reported on 09/22/2015 08/29/13   Historical Provider, MD  FLUoxetine (PROZAC) 10 MG capsule Take 10 mg by mouth daily. Reported on 09/22/2015    Historical Provider, MD  gabapentin (NEURONTIN) 300 MG capsule Take 300 mg by mouth daily. Reported on 09/22/2015 09/19/13   Historical Provider, MD  ibuprofen (ADVIL,MOTRIN) 600 MG tablet Take 1 tablet (600 mg total) by mouth every 6 (six) hours as needed for moderate pain. Patient not taking: Reported on 09/22/2015 08/09/15   Harvel Quale, MD  nitrofurantoin, macrocrystal-monohydrate, (MACROBID) 100 MG capsule Take 100 capsules by mouth daily. Reported on 09/22/2015 08/02/13  Historical Provider, MD  nystatin (MYCOSTATIN) 100000 UNIT/ML suspension Reported on 09/22/2015 07/13/13   Historical Provider, MD  olmesartan (BENICAR) 40 MG tablet Take 40 mg by mouth daily. Reported on 09/22/2015    Historical Provider, MD  valACYclovir (VALTREX) 1000 MG tablet 1 g daily. Reported on 09/22/2015 08/30/13   Historical Provider, MD     Allergies  Allergen Reactions  . Ciprofloxacin Hcl     "makes hands and feet cold"  . Sulfa Antibiotics Hives  . Penicillins Hives and Rash     Patient Active Problem List   Diagnosis Date Noted  . Cervical myelopathy ([place])  10/03/2013  . Shingles 10/03/2013     Family History  Problem Relation Age of Onset  . Heart attack Father   . Stroke Mother      Social History   Social History  . Marital Status: Divorced    Spouse Name: N/A  . Number of Children: 5  . Years of Education: college   Occupational History  .      retired   Social History Main Topics  . Smoking status: Former Smoker    Types: Cigarettes  . Smokeless tobacco: Never Used     Comment: Quit 30 years ago  . Alcohol Use: No     Comment: occasionally  . Drug Use: No  . Sexual Activity: Not on file   Other Topics Concern  . Not on file   Social History Narrative   Patient lives in retirement community [facility] [place]).   Retired.   Education- some college   Left handed.   Caffeine- Two cups daily.        Review of Systems Constitutional: Negative for fever.  Respiratory: Negative for shortness of breath.  Cardiovascular: Negative for chest pain.  Gastrointestinal: Positive for abdominal pain (uncomfortable fullness. ). Negative for nausea, vomiting, diarrhea, constipation and blood in stool.  Genitourinary: Positive for dysuria, urgency (and retention), frequency and hematuria. Negative for flank pain, vaginal discharge, difficulty urinating and vaginal pain.  Musculoskeletal: Negative for back pain.       Objective:  Physical Exam  Constitutional: She is oriented to person, place, and time. Vital signs are normal. She appears well-developed and well-nourished. No distress.  BP 146/80 mmHg  Pulse 78  Temp(Src) 98.2 F (36.8 C) (Oral)  Resp 16  Ht 5\' 4"  (1.626 m)  Wt 168 lb 12.8 oz (76.567 kg)  BMI 28.96 kg/m2  SpO2 97%   HENT:  Head: Normocephalic and atraumatic.  Cardiovascular: Normal rate and regular rhythm.   Murmur heard.  Systolic murmur is present with a grade of 2/6  Pulmonary/Chest: Effort normal and breath sounds normal.  Abdominal: Soft. Normal appearance and bowel sounds are normal. She  exhibits no distension and no mass. There is no hepatosplenomegaly. There is tenderness in the suprapubic area. There is no rigidity, no rebound, no guarding, no CVA tenderness, no tenderness at McBurney's point and negative Murphy's sign. No hernia.  Musculoskeletal: Normal range of motion.       Lumbar back: Normal.  Neurological: She is alert and oriented to person, place, and time.  Skin: Skin is warm and dry. No rash noted. She is not diaphoretic. No pallor.  Psychiatric: She has a normal mood and affect. Her speech is normal and behavior is normal. Judgment normal.         Results for orders placed or performed in visit on 09/22/15  POCT Microscopic Urinalysis [facility])  Result Value Ref Range  WBC,UR,HPF,POC Moderate (A) None WBC/hpf   RBC,UR,HPF,POC Too numerous to count  (A) None RBC/hpf   Bacteria None None, Too numerous to count   Mucus Absent Absent   Epithelial Cells, UR Per Microscopy Few (A) None, Too numerous to count cells/hpf  POCT urinalysis dipstick  Result Value Ref Range   Color, UA brown (A) yellow   Clarity, UA cloudy (A) clear   Glucose, UA negative negative   Bilirubin, UA negative negative   Ketones, POC UA negative negative   Spec Grav, UA 1.015    Blood, UA large (A) negative   pH, UA 8.0    Protein Ur, POC =100 (A) negative   Urobilinogen, UA 0.2    Nitrite, UA Negative Negative   Leukocytes, UA small (1+) (A) Negative       Assessment & Plan:  1. Dysuria PCN, quinolone and Sulfa allergies. Await UCx. Treat empirically with Macrobid. Follow-up with PCP next week as planned. Will need repeat UA when well to verify resolution of hematuria. - POCT Microscopic Urinalysis ([facility]) - POCT urinalysis dipstick - Urine culture - nitrofurantoin (MACRODANTIN) 100 MG capsule; Take 1 capsule (100 mg total) by mouth 4 (four) times daily.  Dispense: 14 capsule; Refill: 0   Fara Chute, PA-C Physician Assistant-Certified [facility]

## 2015-09-23 LAB — URINE CULTURE

## 2015-09-24 NOTE — Addendum Note (Signed)
Addended by: Roselee Culver on: 09/24/2015 04:12 PM   Modules accepted: Miquel Dunn

## 2015-09-24 NOTE — Progress Notes (Signed)
  Medical screening examination/treatment/procedure(s) were performed by non-physician practitioner and as supervising physician I was immediately available for consultation/collaboration.     

## 2015-09-27 DIAGNOSIS — N390 Urinary tract infection, site not specified: Secondary | ICD-10-CM | POA: Diagnosis not present

## 2015-09-27 DIAGNOSIS — E7800 Pure hypercholesterolemia, unspecified: Secondary | ICD-10-CM | POA: Diagnosis not present

## 2015-09-27 DIAGNOSIS — R739 Hyperglycemia, unspecified: Secondary | ICD-10-CM | POA: Diagnosis not present

## 2015-09-27 DIAGNOSIS — I10 Essential (primary) hypertension: Secondary | ICD-10-CM | POA: Diagnosis not present

## 2015-10-16 DIAGNOSIS — D1801 Hemangioma of skin and subcutaneous tissue: Secondary | ICD-10-CM | POA: Diagnosis not present

## 2015-10-16 DIAGNOSIS — L812 Freckles: Secondary | ICD-10-CM | POA: Diagnosis not present

## 2015-10-16 DIAGNOSIS — Z85820 Personal history of malignant melanoma of skin: Secondary | ICD-10-CM | POA: Diagnosis not present

## 2015-11-16 DIAGNOSIS — H26493 Other secondary cataract, bilateral: Secondary | ICD-10-CM | POA: Diagnosis not present

## 2015-11-20 DIAGNOSIS — R300 Dysuria: Secondary | ICD-10-CM | POA: Diagnosis not present

## 2015-11-20 DIAGNOSIS — N390 Urinary tract infection, site not specified: Secondary | ICD-10-CM | POA: Diagnosis not present

## 2015-12-14 DIAGNOSIS — R300 Dysuria: Secondary | ICD-10-CM | POA: Diagnosis not present

## 2016-02-25 DIAGNOSIS — M1711 Unilateral primary osteoarthritis, right knee: Secondary | ICD-10-CM | POA: Diagnosis not present

## 2016-02-25 DIAGNOSIS — M25561 Pain in right knee: Secondary | ICD-10-CM | POA: Diagnosis not present

## 2016-03-24 DIAGNOSIS — I10 Essential (primary) hypertension: Secondary | ICD-10-CM | POA: Diagnosis not present

## 2016-03-24 DIAGNOSIS — R739 Hyperglycemia, unspecified: Secondary | ICD-10-CM | POA: Diagnosis not present

## 2016-03-27 DIAGNOSIS — I10 Essential (primary) hypertension: Secondary | ICD-10-CM | POA: Diagnosis not present

## 2016-03-27 DIAGNOSIS — R739 Hyperglycemia, unspecified: Secondary | ICD-10-CM | POA: Diagnosis not present

## 2016-03-27 DIAGNOSIS — K146 Glossodynia: Secondary | ICD-10-CM | POA: Diagnosis not present

## 2016-04-15 DIAGNOSIS — D225 Melanocytic nevi of trunk: Secondary | ICD-10-CM | POA: Diagnosis not present

## 2016-04-15 DIAGNOSIS — L814 Other melanin hyperpigmentation: Secondary | ICD-10-CM | POA: Diagnosis not present

## 2016-04-15 DIAGNOSIS — L821 Other seborrheic keratosis: Secondary | ICD-10-CM | POA: Diagnosis not present

## 2016-04-15 DIAGNOSIS — Z85820 Personal history of malignant melanoma of skin: Secondary | ICD-10-CM | POA: Diagnosis not present

## 2016-04-16 DIAGNOSIS — K136 Irritative hyperplasia of oral mucosa: Secondary | ICD-10-CM | POA: Diagnosis not present

## 2016-04-16 DIAGNOSIS — R739 Hyperglycemia, unspecified: Secondary | ICD-10-CM | POA: Diagnosis not present

## 2016-04-16 DIAGNOSIS — I10 Essential (primary) hypertension: Secondary | ICD-10-CM | POA: Diagnosis not present

## 2016-04-16 DIAGNOSIS — E7800 Pure hypercholesterolemia, unspecified: Secondary | ICD-10-CM | POA: Diagnosis not present

## 2016-05-09 ENCOUNTER — Other Ambulatory Visit: Payer: Self-pay

## 2016-06-04 DIAGNOSIS — M25561 Pain in right knee: Secondary | ICD-10-CM | POA: Diagnosis not present

## 2016-06-04 DIAGNOSIS — M1711 Unilateral primary osteoarthritis, right knee: Secondary | ICD-10-CM | POA: Diagnosis not present

## 2016-07-24 DIAGNOSIS — M25561 Pain in right knee: Secondary | ICD-10-CM | POA: Diagnosis not present

## 2016-07-24 DIAGNOSIS — M25572 Pain in left ankle and joints of left foot: Secondary | ICD-10-CM | POA: Diagnosis not present

## 2016-07-24 DIAGNOSIS — G8929 Other chronic pain: Secondary | ICD-10-CM | POA: Diagnosis not present

## 2016-09-03 DIAGNOSIS — M1711 Unilateral primary osteoarthritis, right knee: Secondary | ICD-10-CM | POA: Diagnosis not present

## 2016-09-03 DIAGNOSIS — M170 Bilateral primary osteoarthritis of knee: Secondary | ICD-10-CM | POA: Diagnosis not present

## 2016-09-03 DIAGNOSIS — M25562 Pain in left knee: Secondary | ICD-10-CM | POA: Diagnosis not present

## 2016-09-03 DIAGNOSIS — M25561 Pain in right knee: Secondary | ICD-10-CM | POA: Diagnosis not present

## 2016-09-09 DIAGNOSIS — M25561 Pain in right knee: Secondary | ICD-10-CM | POA: Diagnosis not present

## 2016-09-09 DIAGNOSIS — R262 Difficulty in walking, not elsewhere classified: Secondary | ICD-10-CM | POA: Diagnosis not present

## 2016-09-09 DIAGNOSIS — M25461 Effusion, right knee: Secondary | ICD-10-CM | POA: Diagnosis not present

## 2016-09-09 DIAGNOSIS — M1711 Unilateral primary osteoarthritis, right knee: Secondary | ICD-10-CM | POA: Diagnosis not present

## 2016-10-13 DIAGNOSIS — I10 Essential (primary) hypertension: Secondary | ICD-10-CM | POA: Diagnosis not present

## 2016-10-13 DIAGNOSIS — R739 Hyperglycemia, unspecified: Secondary | ICD-10-CM | POA: Diagnosis not present

## 2016-10-13 DIAGNOSIS — E7800 Pure hypercholesterolemia, unspecified: Secondary | ICD-10-CM | POA: Diagnosis not present

## 2016-10-14 DIAGNOSIS — Z85820 Personal history of malignant melanoma of skin: Secondary | ICD-10-CM | POA: Diagnosis not present

## 2016-10-14 DIAGNOSIS — L821 Other seborrheic keratosis: Secondary | ICD-10-CM | POA: Diagnosis not present

## 2016-10-14 DIAGNOSIS — L814 Other melanin hyperpigmentation: Secondary | ICD-10-CM | POA: Diagnosis not present

## 2016-10-14 DIAGNOSIS — D225 Melanocytic nevi of trunk: Secondary | ICD-10-CM | POA: Diagnosis not present

## 2016-10-16 DIAGNOSIS — M25561 Pain in right knee: Secondary | ICD-10-CM | POA: Diagnosis not present

## 2016-10-16 DIAGNOSIS — R739 Hyperglycemia, unspecified: Secondary | ICD-10-CM | POA: Diagnosis not present

## 2016-10-16 DIAGNOSIS — I10 Essential (primary) hypertension: Secondary | ICD-10-CM | POA: Diagnosis not present

## 2016-10-16 DIAGNOSIS — G8929 Other chronic pain: Secondary | ICD-10-CM | POA: Diagnosis not present

## 2016-10-22 DIAGNOSIS — M1711 Unilateral primary osteoarthritis, right knee: Secondary | ICD-10-CM | POA: Diagnosis not present

## 2016-10-29 DIAGNOSIS — R000 Tachycardia, unspecified: Secondary | ICD-10-CM | POA: Diagnosis not present

## 2016-10-29 DIAGNOSIS — Z01810 Encounter for preprocedural cardiovascular examination: Secondary | ICD-10-CM | POA: Diagnosis not present

## 2016-10-29 DIAGNOSIS — R011 Cardiac murmur, unspecified: Secondary | ICD-10-CM | POA: Diagnosis not present

## 2016-10-31 DIAGNOSIS — Z01810 Encounter for preprocedural cardiovascular examination: Secondary | ICD-10-CM | POA: Diagnosis not present

## 2016-10-31 DIAGNOSIS — I10 Essential (primary) hypertension: Secondary | ICD-10-CM | POA: Diagnosis not present

## 2016-11-07 DIAGNOSIS — Z01810 Encounter for preprocedural cardiovascular examination: Secondary | ICD-10-CM | POA: Diagnosis not present

## 2016-11-07 DIAGNOSIS — I10 Essential (primary) hypertension: Secondary | ICD-10-CM | POA: Diagnosis not present

## 2016-11-07 DIAGNOSIS — E782 Mixed hyperlipidemia: Secondary | ICD-10-CM | POA: Diagnosis not present

## 2016-11-11 ENCOUNTER — Encounter (HOSPITAL_COMMUNITY): Payer: Self-pay | Admitting: *Deleted

## 2016-11-14 DIAGNOSIS — M1711 Unilateral primary osteoarthritis, right knee: Secondary | ICD-10-CM | POA: Diagnosis not present

## 2016-11-17 ENCOUNTER — Ambulatory Visit: Payer: Self-pay | Admitting: Orthopedic Surgery

## 2016-11-19 ENCOUNTER — Ambulatory Visit: Payer: Self-pay | Admitting: Orthopedic Surgery

## 2016-11-19 NOTE — H&P (Signed)
TOTAL KNEE ADMISSION H&P  Patient is being admitted for right total knee arthroplasty.  Subjective:  Chief Complaint:right knee pain.  HPI: Faith Ritter, 81 y.o. female, has a history of pain and functional disability in the right knee due to arthritis and has failed non-surgical conservative treatments for greater than 12 weeks to includeNSAID's and/or analgesics, corticosteriod injections, viscosupplementation injections, flexibility and strengthening excercises, use of assistive devices, weight reduction as appropriate and activity modification.  Onset of symptoms was gradual, starting 3 years ago with gradually worsening course since that time. The patient noted no past surgery on the right knee(s).  Patient currently rates pain in the right knee(s) at 10 out of 10 with activity. Patient has night pain, worsening of pain with activity and weight bearing, pain that interferes with activities of daily living, pain with passive range of motion, crepitus and joint swelling.  Patient has evidence of subchondral cysts, subchondral sclerosis, periarticular osteophytes and joint space narrowing by imaging studies. There is no active infection.  Patient Active Problem List   Diagnosis Date Noted  . Cervical myelopathy ([place]) 10/03/2013  . Shingles 10/03/2013   Past Medical History:  Diagnosis Date  . Hypercholesteremia   . Hypertension     Past Surgical History:  Procedure Laterality Date  . CATARACT EXTRACTION Bilateral   . FOOT SURGERY Right      (Not in a hospital admission) Allergies  Allergen Reactions  . Ciprofloxacin Hcl     "makes hands and feet cold"  . Sulfa Antibiotics Hives  . Penicillins Hives and Rash    Social History  Substance Use Topics  . Smoking status: Former Smoker    Types: Cigarettes  . Smokeless tobacco: Never Used     Comment: Quit 30 years ago  . Alcohol use No     Comment: occasionally    Family History  Problem Relation Age of Onset  . Stroke  Mother   . Heart attack Father      Review of Systems  Constitutional: Negative.   HENT: Negative.   Eyes: Negative.   Respiratory: Negative.   Cardiovascular: Negative.   Gastrointestinal: Negative.   Genitourinary: Negative.   Musculoskeletal: Positive for joint pain.  Skin: Negative.   Neurological: Negative.   Endo/Heme/Allergies: Negative.   Psychiatric/Behavioral: Negative.     Objective:  Physical Exam  Vitals reviewed. Constitutional: She is oriented to person, place, and time. She appears well-developed and well-nourished.  HENT:  Head: Normocephalic and atraumatic.  Eyes: Conjunctivae and EOM are normal. Pupils are equal, round, and reactive to light.  Neck: Normal range of motion. Neck supple. No thyromegaly present.  Cardiovascular: Normal rate, regular rhythm and intact distal pulses.   Respiratory: Effort normal and breath sounds normal. No respiratory distress.  GI: Soft. Bowel sounds are normal. She exhibits no distension.  Genitourinary:  Genitourinary Comments: deferred  Musculoskeletal:       Right knee: She exhibits decreased range of motion and swelling. Tenderness found. Medial joint line tenderness noted.  Neurological: She is alert and oriented to person, place, and time. She has normal reflexes.  Skin: Skin is warm and dry.  Psychiatric: She has a normal mood and affect. Her behavior is normal. Judgment and thought content normal.    Vital signs in last 24 hours: @VSRANGES @  Labs:   Estimated body mass index is 28.97 kg/m as calculated from the following:   Height as of 09/22/15: 5\' 4"  (1.626 m).   Weight as of 09/22/15: 76.6  kg (168 lb 12.8 oz).   Imaging Review Plain radiographs demonstrate severe degenerative joint disease of the right knee(s). The overall alignment issignificant varus. The bone quality appears to be adequate for age and reported activity level.  Assessment/Plan:  End stage arthritis, right knee   The patient  history, physical examination, clinical judgment of the provider and imaging studies are consistent with end stage degenerative joint disease of the right knee(s) and total knee arthroplasty is deemed medically necessary. The treatment options including medical management, injection therapy arthroscopy and arthroplasty were discussed at length. The risks and benefits of total knee arthroplasty were presented and reviewed. The risks due to aseptic loosening, infection, stiffness, patella tracking problems, thromboembolic complications and other imponderables were discussed. The patient acknowledged the explanation, agreed to proceed with the plan and consent was signed. Patient is being admitted for inpatient treatment for surgery, pain control, PT, OT, prophylactic antibiotics, VTE prophylaxis, progressive ambulation and ADL's and discharge planning. The patient is planning to be discharged home with outpatient PT. Already has DME.

## 2016-11-24 NOTE — Progress Notes (Signed)
07/17/2016- Pre-operative clearance from Dr. Parks Neptune on chart. 10/27/2016- EKG From [facility] on chart.  10/31/2016- Nuclear Cardiology Report (Stress test ) from [facility] on chart.  11/07/2016- Last office visit from Dr. Einar Gip on chart. 10/16/2016- Pre-operative clearance from Dr. Jani Gravel on chart.

## 2016-11-24 NOTE — Patient Instructions (Addendum)
Faith Ritter  11/24/2016   Your procedure is scheduled on: Thursday 12/04/2016  Report to [facility] Main  Entrance take [place]  elevators to 3rd floor to  [place] at  215  PM.  Call this number if you have problems the morning of surgery [phone]   Remember: ONLY 1 PERSON MAY GO WITH YOU TO SHORT STAY TO GET  READY MORNING OF [place].   Do not eat food  :After Midnight. May have clear liquids from [place] up until 1015AM. Then nothing after [place] Allowed                                                                     Foods Excluded  Coffee and tea, regular and decaf                             liquids that you cannot  Plain Jell-O in any flavor                                             see through such as: Fruit ices (not with fruit pulp)                                     milk, soups, orange juice  Iced Popsicles                                    All solid food Carbonated beverages, regular and diet                                    Cranberry, grape and apple juices Sports drinks like Gatorade Lightly seasoned clear broth or consume(fat free) Sugar, honey syrup  Sample Menu Breakfast                                Lunch                                     Supper Cranberry juice                    Beef broth                            Chicken broth Jell-O                                     Grape juice  Apple juice Coffee or tea                        Jell-O                                      Popsicle                                                Coffee or tea                        Coffee or tea  _____________________________________________________________________     Take these medicines the morning of surgery with A SIP OF WATER: Metoprolol, Isosorbide-Hydralazine (Bidil)                                 You may not have any metal on your body including  hair pins and              piercings  Do not wear jewelry, make-up, lotions, powders or perfumes, deodorant             Do not wear nail polish.  Do not shave  48 hours prior to surgery.              Men may shave face and neck.   Do not bring valuables to the hospital. [place].  Contacts, dentures or bridgework may not be worn into surgery.  Leave suitcase in the car. After surgery it may be brought to your room.                  Please read over the following fact sheets you were given: _____________________________________________________________________             [facility] - Preparing for Surgery Before surgery, you can play an important role.  Because skin is not sterile, your skin needs to be as free of germs as possible.  You can reduce the number of germs on your skin by washing with CHG (chlorahexidine gluconate) soap before surgery.  CHG is an antiseptic cleaner which kills germs and bonds with the skin to continue killing germs even after washing. Please DO NOT use if you have an allergy to CHG or antibacterial soaps.  If your skin becomes reddened/irritated stop using the CHG and inform your nurse when you arrive at Short Stay. Do not shave (including legs and underarms) for at least 48 hours prior to the first CHG shower.  You may shave your face/neck. Please follow these instructions carefully:  1.  Shower with CHG Soap the night before surgery and the  morning of Surgery.  2.  If you choose to wash your hair, wash your hair first as usual with your  normal  shampoo.  3.  After you shampoo, rinse your hair and body thoroughly to remove the  shampoo.                           4.  Use CHG as you  would any other liquid soap.  You can apply chg directly  to the skin and wash                       Gently with a scrungie or clean washcloth.  5.  Apply the CHG Soap to your body ONLY FROM THE NECK DOWN.   Do not use on face/ open                            Wound or open sores. Avoid contact with eyes, ears mouth and genitals (private parts).                       Wash face,  Genitals (private parts) with your normal soap.             6.  Wash thoroughly, paying special attention to the area where your surgery  will be performed.  7.  Thoroughly rinse your body with warm water from the neck down.  8.  DO NOT shower/wash with your normal soap after using and rinsing off  the CHG Soap.                9.  Pat yourself dry with a clean towel.            10.  Wear clean pajamas.            11.  Place clean sheets on your bed the night of your first shower and do not  sleep with pets. Day of Surgery : Do not apply any lotions/deodorants the morning of surgery.  Please wear clean clothes to the hospital/surgery center.  FAILURE TO FOLLOW THESE INSTRUCTIONS MAY RESULT IN THE CANCELLATION OF YOUR SURGERY PATIENT SIGNATURE_________________________________  NURSE SIGNATURE__________________________________  ________________________________________________________________________   Adam Phenix  An incentive spirometer is a tool that can help keep your lungs clear and active. This tool measures how well you are filling your lungs with each breath. Taking long deep breaths may help reverse or decrease the chance of developing breathing (pulmonary) problems (especially infection) following:  A long period of time when you are unable to move or be active. BEFORE THE PROCEDURE   If the spirometer includes an indicator to show your best effort, your nurse or respiratory therapist will set it to a desired goal.  If possible, sit up straight or lean slightly forward. Try not to slouch.  Hold the incentive spirometer in an upright position. INSTRUCTIONS FOR USE  1. Sit on the edge of your bed if possible, or sit up as far as you can in bed or on a chair. 2. Hold the incentive spirometer in an upright position. 3. Breathe  out normally. 4. Place the mouthpiece in your mouth and seal your lips tightly around it. 5. Breathe in slowly and as deeply as possible, raising the piston or the ball toward the top of the column. 6. Hold your breath for 3-5 seconds or for as long as possible. Allow the piston or ball to fall to the bottom of the column. 7. Remove the mouthpiece from your mouth and breathe out normally. 8. Rest for a few seconds and repeat Steps 1 through 7 at least 10 times every 1-2 hours when you are awake. Take your time and take a few normal breaths between deep breaths. 9. The spirometer may include an indicator to show your best effort. Use the  indicator as a goal to work toward during each repetition. 10. After each set of 10 deep breaths, practice coughing to be sure your lungs are clear. If you have an incision (the cut made at the time of surgery), support your incision when coughing by placing a pillow or rolled up towels firmly against it. Once you are able to get out of bed, walk around indoors and cough well. You may stop using the incentive spirometer when instructed by your caregiver.  RISKS AND COMPLICATIONS  Take your time so you do not get dizzy or light-headed.  If you are in pain, you may need to take or ask for pain medication before doing incentive spirometry. It is harder to take a deep breath if you are having pain. AFTER USE  Rest and breathe slowly and easily.  It can be helpful to keep track of a log of your progress. Your caregiver can provide you with a simple table to help with this. If you are using the spirometer at home, follow these instructions: [place] IF:   You are having difficultly using the spirometer.  You have trouble using the spirometer as often as instructed.  Your pain medication is not giving enough relief while using the spirometer.  You develop fever of 100.5 F (38.1 C) or higher. SEEK IMMEDIATE MEDICAL CARE IF:   You cough up bloody  sputum that had not been present before.  You develop fever of 102 F (38.9 C) or greater.  You develop worsening pain at or near the incision site. MAKE SURE YOU:   Understand these instructions.  Will watch your condition.  Will get help right away if you are not doing well or get worse. Document Released: 01/05/2007 Document Revised: 11/17/2011 Document Reviewed: 03/08/2007 ExitCare Patient Information 2014 Exit[employer], [place].   ________________________________________________________________________  WHAT IS A BLOOD TRANSFUSION? Blood Transfusion Information  A transfusion is the replacement of blood or some of its parts. Blood is made up of multiple cells which provide different functions.  Red blood cells carry oxygen and are used for blood loss replacement.  White blood cells fight against infection.  Platelets control bleeding.  Plasma helps clot blood.  Other blood products are available for specialized needs, such as hemophilia or other clotting disorders. BEFORE THE TRANSFUSION  Who gives blood for transfusions?   Healthy volunteers who are fully evaluated to make sure their blood is safe. This is blood bank blood. Transfusion therapy is the safest it has ever been in the practice of medicine. Before blood is taken from a donor, a complete history is taken to make sure that person has no history of diseases nor engages in risky social behavior (examples are intravenous drug use or sexual activity with multiple partners). The donor's travel history is screened to minimize risk of transmitting infections, such as malaria. The donated blood is tested for signs of infectious diseases, such as HIV and hepatitis. The blood is then tested to be sure it is compatible with you in order to minimize the chance of a transfusion reaction. If you or a relative donates blood, this is often done in anticipation of surgery and is not appropriate for emergency situations. It takes many days  to process the donated blood. RISKS AND COMPLICATIONS Although transfusion therapy is very safe and saves many lives, the main dangers of transfusion include:   Getting an infectious disease.  Developing a transfusion reaction. This is an allergic reaction to something in the blood you  were given. Every precaution is taken to prevent this. The decision to have a blood transfusion has been considered carefully by your caregiver before blood is given. Blood is not given unless the benefits outweigh the risks. AFTER THE TRANSFUSION  Right after receiving a blood transfusion, you will usually feel much better and more energetic. This is especially true if your red blood cells have gotten low (anemic). The transfusion raises the level of the red blood cells which carry oxygen, and this usually causes an energy increase.  The nurse administering the transfusion will monitor you carefully for complications. HOME CARE INSTRUCTIONS  No special instructions are needed after a transfusion. You may find your energy is better. Speak with your caregiver about any limitations on activity for underlying diseases you may have. SEEK MEDICAL CARE IF:   Your condition is not improving after your transfusion.  You develop redness or irritation at the intravenous (IV) site. SEEK IMMEDIATE MEDICAL CARE IF:  Any of the following symptoms occur over the next 12 hours:  Shaking chills.  You have a temperature by mouth above 102 F (38.9 C), not controlled by medicine.  Chest, back, or muscle pain.  People around you feel you are not acting correctly or are confused.  Shortness of breath or difficulty breathing.  Dizziness and fainting.  You get a rash or develop hives.  You have a decrease in urine output.  Your urine turns a dark color or changes to pink, red, or brown. Any of the following symptoms occur over the next 10 days:  You have a temperature by mouth above 102 F (38.9 C), not controlled  by medicine.  Shortness of breath.  Weakness after normal activity.  The white part of the eye turns yellow (jaundice).  You have a decrease in the amount of urine or are urinating less often.  Your urine turns a dark color or changes to pink, red, or brown. Document Released: 08/22/2000 Document Revised: 11/17/2011 Document Reviewed: 04/10/2008 [facility] Patient Information 2014 [place], [place].  _______________________________________________________________________

## 2016-11-25 ENCOUNTER — Encounter (HOSPITAL_COMMUNITY): Payer: Self-pay

## 2016-11-25 ENCOUNTER — Encounter (HOSPITAL_COMMUNITY)
Admission: RE | Admit: 2016-11-25 | Discharge: 2016-11-25 | Disposition: A | Discharge status: Home / Self Care | Payer: Medicare Other | Source: Ambulatory Visit | Attending: Orthopedic Surgery | Admitting: Orthopedic Surgery

## 2016-11-25 DIAGNOSIS — M1711 Unilateral primary osteoarthritis, right knee: Secondary | ICD-10-CM | POA: Insufficient documentation

## 2016-11-25 DIAGNOSIS — Z01818 Encounter for other preprocedural examination: Secondary | ICD-10-CM | POA: Diagnosis not present

## 2016-11-25 DIAGNOSIS — I10 Essential (primary) hypertension: Secondary | ICD-10-CM | POA: Diagnosis not present

## 2016-11-25 HISTORY — DX: Unspecified osteoarthritis, unspecified site: M19.90

## 2016-11-25 LAB — BASIC METABOLIC PANEL
Anion gap: 8 (ref 5–15)
BUN: 26 mg/dL — ABNORMAL HIGH (ref 6–20)
CO2: 26 mmol/L (ref 22–32)
Calcium: 9.7 mg/dL (ref 8.9–10.3)
Chloride: 104 mmol/L (ref 101–111)
Creatinine, Ser: 0.98 mg/dL (ref 0.44–1.00)
GFR calc Af Amer: >60 mL/min (ref >60)
GFR, EST NON AFRICAN AMERICAN: 54 mL/min — AB (ref >60)
GLUCOSE: 96 mg/dL (ref 65–99)
POTASSIUM: 4 mmol/L (ref 3.5–5.1)
Sodium: 138 mmol/L (ref 135–145)

## 2016-11-25 LAB — CBC
HCT: 39.2 % (ref 36.0–46.0)
Hemoglobin: 13 g/dL (ref 12.0–15.0)
MCH: 30.2 pg (ref 26.0–34.0)
MCHC: 33.2 g/dL (ref 30.0–36.0)
MCV: 91.2 fL (ref 78.0–100.0)
PLATELETS: 268 10*3/uL (ref 150–400)
RBC: 4.3 MIL/uL (ref 3.87–5.11)
RDW: 14.1 % (ref 11.5–15.5)
WBC: 8 10*3/uL (ref 4.0–10.5)

## 2016-11-25 LAB — ABO/RH: ABO/RH(D): O POS

## 2016-11-25 LAB — SURGICAL PCR SCREEN
MRSA, PCR: NEGATIVE
STAPHYLOCOCCUS AUREUS: NEGATIVE

## 2016-12-04 ENCOUNTER — Encounter (HOSPITAL_COMMUNITY)
Admission: RE | Disposition: A | Discharge status: Home Health | Payer: Self-pay | Source: Ambulatory Visit | Attending: Orthopedic Surgery

## 2016-12-04 ENCOUNTER — Inpatient Hospital Stay (HOSPITAL_COMMUNITY): Payer: Medicare Other

## 2016-12-04 ENCOUNTER — Inpatient Hospital Stay (HOSPITAL_COMMUNITY)
Admission: RE | Admit: 2016-12-04 | Discharge: 2016-12-06 | DRG: 470 | Disposition: A | Discharge status: Home Health | Payer: Medicare Other | Source: Ambulatory Visit | Attending: Orthopedic Surgery | Admitting: Orthopedic Surgery

## 2016-12-04 ENCOUNTER — Encounter (HOSPITAL_COMMUNITY): Payer: Self-pay | Admitting: *Deleted

## 2016-12-04 ENCOUNTER — Inpatient Hospital Stay (HOSPITAL_COMMUNITY): Payer: Medicare Other | Admitting: Certified Registered"

## 2016-12-04 DIAGNOSIS — G8918 Other acute postprocedural pain: Secondary | ICD-10-CM | POA: Diagnosis not present

## 2016-12-04 DIAGNOSIS — Z8249 Family history of ischemic heart disease and other diseases of the circulatory system: Secondary | ICD-10-CM | POA: Diagnosis not present

## 2016-12-04 DIAGNOSIS — Z9842 Cataract extraction status, left eye: Secondary | ICD-10-CM | POA: Diagnosis not present

## 2016-12-04 DIAGNOSIS — Z823 Family history of stroke: Secondary | ICD-10-CM | POA: Diagnosis not present

## 2016-12-04 DIAGNOSIS — Z87891 Personal history of nicotine dependence: Secondary | ICD-10-CM

## 2016-12-04 DIAGNOSIS — M1711 Unilateral primary osteoarthritis, right knee: Secondary | ICD-10-CM | POA: Diagnosis not present

## 2016-12-04 DIAGNOSIS — Z9841 Cataract extraction status, right eye: Secondary | ICD-10-CM

## 2016-12-04 DIAGNOSIS — Z96651 Presence of right artificial knee joint: Secondary | ICD-10-CM | POA: Diagnosis not present

## 2016-12-04 DIAGNOSIS — R202 Paresthesia of skin: Secondary | ICD-10-CM | POA: Diagnosis not present

## 2016-12-04 DIAGNOSIS — E7800 Pure hypercholesterolemia, unspecified: Secondary | ICD-10-CM | POA: Diagnosis present

## 2016-12-04 DIAGNOSIS — Z881 Allergy status to other antibiotic agents status: Secondary | ICD-10-CM

## 2016-12-04 DIAGNOSIS — Z79899 Other long term (current) drug therapy: Secondary | ICD-10-CM

## 2016-12-04 DIAGNOSIS — Z880 Allergy status to penicillin: Secondary | ICD-10-CM

## 2016-12-04 DIAGNOSIS — I10 Essential (primary) hypertension: Secondary | ICD-10-CM | POA: Diagnosis not present

## 2016-12-04 DIAGNOSIS — Z09 Encounter for follow-up examination after completed treatment for conditions other than malignant neoplasm: Secondary | ICD-10-CM

## 2016-12-04 DIAGNOSIS — Z882 Allergy status to sulfonamides status: Secondary | ICD-10-CM | POA: Diagnosis not present

## 2016-12-04 DIAGNOSIS — M25761 Osteophyte, right knee: Secondary | ICD-10-CM | POA: Diagnosis not present

## 2016-12-04 DIAGNOSIS — Z471 Aftercare following joint replacement surgery: Secondary | ICD-10-CM | POA: Diagnosis not present

## 2016-12-04 DIAGNOSIS — G959 Disease of spinal cord, unspecified: Secondary | ICD-10-CM | POA: Diagnosis not present

## 2016-12-04 HISTORY — DX: Thromboembolism in pregnancy, unspecified trimester: O88.219

## 2016-12-04 HISTORY — PX: KNEE ARTHROPLASTY: SHX992

## 2016-12-04 HISTORY — DX: Other embolism in pregnancy, unspecified trimester: O88.819

## 2016-12-04 HISTORY — DX: Nonrheumatic mitral (valve) prolapse: I34.1

## 2016-12-04 HISTORY — DX: Cardiac murmur, unspecified: R01.1

## 2016-12-04 LAB — TYPE AND SCREEN
ABO/RH(D): O POS
Antibody Screen: NEGATIVE

## 2016-12-04 SURGERY — ARTHROPLASTY, KNEE, TOTAL, USING IMAGELESS COMPUTER-ASSISTED NAVIGATION
Anesthesia: Spinal | Site: Knee | Laterality: Right

## 2016-12-04 MED ORDER — METOCLOPRAMIDE HCL 5 MG PO TABS: 5.0000 mg | ORAL_TABLET | Freq: Three times a day (TID) | ORAL | Status: DC | PRN

## 2016-12-04 MED ORDER — BUPIVACAINE HCL (PF) 0.25 % IJ SOLN
INTRAMUSCULAR | Status: AC
  Filled 2016-12-04: qty 30

## 2016-12-04 MED ORDER — BUPIVACAINE HCL (PF) 0.25 % IJ SOLN
INTRAMUSCULAR | Status: DC | PRN
  Administered 2016-12-04: 30 mL

## 2016-12-04 MED ORDER — STERILE WATER FOR IRRIGATION IR SOLN
Status: DC | PRN
  Administered 2016-12-04: 2000 mL

## 2016-12-04 MED ORDER — PROPOFOL 10 MG/ML IV BOLUS
INTRAVENOUS | Status: AC
  Filled 2016-12-04: qty 20

## 2016-12-04 MED ORDER — PHENOL 1.4 % MT LIQD: 1.0000 | OROMUCOSAL | Status: DC | PRN

## 2016-12-04 MED ORDER — ONDANSETRON HCL 4 MG/2ML IJ SOLN
INTRAMUSCULAR | Status: DC | PRN
  Administered 2016-12-04: 4 mg via INTRAVENOUS

## 2016-12-04 MED ORDER — POVIDONE-IODINE 10 % EX SWAB: 2.0000 "application " | Freq: Once | CUTANEOUS | Status: DC

## 2016-12-04 MED ORDER — FENOFIBRATE 54 MG PO TABS
54.0000 mg | ORAL_TABLET | Freq: Every day | ORAL | Status: DC
  Administered 2016-12-05 – 2016-12-06 (×2): 54 mg via ORAL
  Filled 2016-12-04 (×2): qty 1

## 2016-12-04 MED ORDER — LIDOCAINE 2% (20 MG/ML) 5 ML SYRINGE
INTRAMUSCULAR | Status: DC | PRN
  Administered 2016-12-04: 100 mg via INTRAVENOUS

## 2016-12-04 MED ORDER — ONDANSETRON HCL 4 MG/2ML IJ SOLN: 4.0000 mg | Freq: Four times a day (QID) | INTRAMUSCULAR | Status: DC | PRN

## 2016-12-04 MED ORDER — BUPIVACAINE-EPINEPHRINE (PF) 0.5% -1:200000 IJ SOLN
INTRAMUSCULAR | Status: DC | PRN
  Administered 2016-12-04: 25 mL via PERINEURAL

## 2016-12-04 MED ORDER — DOCUSATE SODIUM 100 MG PO CAPS
100.0000 mg | ORAL_CAPSULE | Freq: Two times a day (BID) | ORAL | Status: DC
Start: 2016-12-04 — End: 2016-12-06
  Administered 2016-12-04 – 2016-12-06 (×4): 100 mg via ORAL
  Filled 2016-12-04 (×4): qty 1

## 2016-12-04 MED ORDER — FENTANYL CITRATE (PF) 100 MCG/2ML IJ SOLN
100.0000 ug | Freq: Once | INTRAMUSCULAR | Status: AC
  Administered 2016-12-04 (×2): 50 ug via INTRAVENOUS

## 2016-12-04 MED ORDER — ISOPROPYL ALCOHOL 70 % SOLN
Status: AC
  Filled 2016-12-04: qty 480

## 2016-12-04 MED ORDER — MONTELUKAST SODIUM 10 MG PO TABS
10.0000 mg | ORAL_TABLET | Freq: Every day | ORAL | Status: DC
  Administered 2016-12-04 – 2016-12-05 (×2): 10 mg via ORAL
  Filled 2016-12-04 (×2): qty 1

## 2016-12-04 MED ORDER — DIPHENHYDRAMINE HCL 12.5 MG/5ML PO ELIX: 12.5000 mg | ORAL_SOLUTION | ORAL | Status: DC | PRN

## 2016-12-04 MED ORDER — MIDAZOLAM HCL 2 MG/2ML IJ SOLN
INTRAMUSCULAR | Status: AC
  Filled 2016-12-04: qty 2

## 2016-12-04 MED ORDER — PROPOFOL 500 MG/50ML IV EMUL
INTRAVENOUS | Status: DC | PRN
  Administered 2016-12-04: 100 ug/kg/min via INTRAVENOUS

## 2016-12-04 MED ORDER — METOPROLOL SUCCINATE ER 50 MG PO TB24
50.0000 mg | ORAL_TABLET | Freq: Every day | ORAL | Status: DC
  Administered 2016-12-05 – 2016-12-06 (×2): 50 mg via ORAL
  Filled 2016-12-04 (×2): qty 1

## 2016-12-04 MED ORDER — ACETAMINOPHEN 325 MG PO TABS: 650.0000 mg | ORAL_TABLET | Freq: Four times a day (QID) | ORAL | Status: DC | PRN

## 2016-12-04 MED ORDER — CALCIUM CITRATE-VITAMIN D 500-400 MG-UNIT PO CHEW
1.0000 | CHEWABLE_TABLET | Freq: Two times a day (BID) | ORAL | Status: DC
  Administered 2016-12-05 – 2016-12-06 (×3): 1 via ORAL
  Filled 2016-12-04 (×3): qty 1

## 2016-12-04 MED ORDER — MIDAZOLAM HCL 5 MG/ML IJ SOLN
2.0000 mg | Freq: Once | INTRAMUSCULAR | Status: AC
  Administered 2016-12-04 (×2): 1 mg via INTRAVENOUS

## 2016-12-04 MED ORDER — FENTANYL CITRATE (PF) 100 MCG/2ML IJ SOLN: 25.0000 ug | INTRAMUSCULAR | Status: DC | PRN

## 2016-12-04 MED ORDER — SODIUM CHLORIDE 0.9 % IR SOLN
Status: DC | PRN
  Administered 2016-12-04: 1000 mL

## 2016-12-04 MED ORDER — SENNA 8.6 MG PO TABS: 2.0000 | ORAL_TABLET | Freq: Every day | ORAL | 3 refills | Status: DC

## 2016-12-04 MED ORDER — EPHEDRINE SULFATE-NACL 50-0.9 MG/10ML-% IV SOSY
PREFILLED_SYRINGE | INTRAVENOUS | Status: DC | PRN
  Administered 2016-12-04 (×2): 10 mg via INTRAVENOUS

## 2016-12-04 MED ORDER — LOSARTAN POTASSIUM-HCTZ 100-25 MG PO TABS: 1.0000 | ORAL_TABLET | Freq: Every day | ORAL | Status: DC

## 2016-12-04 MED ORDER — METOCLOPRAMIDE HCL 5 MG/ML IJ SOLN: 10.0000 mg | Freq: Once | INTRAMUSCULAR | Status: DC | PRN

## 2016-12-04 MED ORDER — KETOROLAC TROMETHAMINE 30 MG/ML IJ SOLN
INTRAMUSCULAR | Status: DC | PRN
  Administered 2016-12-04: 30 mg

## 2016-12-04 MED ORDER — EPHEDRINE 5 MG/ML INJ
INTRAVENOUS | Status: AC
  Filled 2016-12-04: qty 10

## 2016-12-04 MED ORDER — L-METHYLFOLATE-B6-B12 3-35-2 MG PO TABS
1.0000 | ORAL_TABLET | Freq: Two times a day (BID) | ORAL | Status: DC
  Administered 2016-12-05 – 2016-12-06 (×3): 1 via ORAL
  Filled 2016-12-04 (×3): qty 1

## 2016-12-04 MED ORDER — CHLORHEXIDINE GLUCONATE 4 % EX LIQD: 60.0000 mL | Freq: Once | CUTANEOUS | Status: DC

## 2016-12-04 MED ORDER — VANCOMYCIN HCL IN DEXTROSE 1-5 GM/200ML-% IV SOLN
1000.0000 mg | INTRAVENOUS | Status: AC
  Administered 2016-12-04: 1000 mg via INTRAVENOUS
  Filled 2016-12-04: qty 200

## 2016-12-04 MED ORDER — DOCUSATE SODIUM 100 MG PO CAPS: 100.0000 mg | ORAL_CAPSULE | Freq: Two times a day (BID) | ORAL | 3 refills | Status: DC

## 2016-12-04 MED ORDER — LIDOCAINE 2% (20 MG/ML) 5 ML SYRINGE
INTRAMUSCULAR | Status: AC
  Filled 2016-12-04: qty 5

## 2016-12-04 MED ORDER — ISOPROPYL ALCOHOL 70 % SOLN
Status: DC | PRN
  Administered 2016-12-04: 1 via TOPICAL

## 2016-12-04 MED ORDER — FENTANYL CITRATE (PF) 100 MCG/2ML IJ SOLN
INTRAMUSCULAR | Status: AC
  Filled 2016-12-04: qty 2

## 2016-12-04 MED ORDER — ACETAMINOPHEN 650 MG RE SUPP: 650.0000 mg | Freq: Four times a day (QID) | RECTAL | Status: DC | PRN

## 2016-12-04 MED ORDER — SODIUM CHLORIDE 0.9 % IJ SOLN
INTRAMUSCULAR | Status: AC
Start: 2016-12-04 — End: 2016-12-04
  Filled 2016-12-04: qty 50

## 2016-12-04 MED ORDER — SODIUM CHLORIDE 0.9 % IV SOLN
INTRAVENOUS | Status: DC
  Administered 2016-12-04: via INTRAVENOUS

## 2016-12-04 MED ORDER — METHOCARBAMOL 500 MG PO TABS
500.0000 mg | ORAL_TABLET | Freq: Four times a day (QID) | ORAL | Status: DC | PRN
  Administered 2016-12-05 (×2): 500 mg via ORAL
  Filled 2016-12-04 (×2): qty 1

## 2016-12-04 MED ORDER — HYDROCODONE-ACETAMINOPHEN 5-325 MG PO TABS: 1.0000 | ORAL_TABLET | ORAL | 0 refills | Status: DC | PRN

## 2016-12-04 MED ORDER — METHOCARBAMOL 1000 MG/10ML IJ SOLN
500.0000 mg | Freq: Four times a day (QID) | INTRAVENOUS | Status: DC | PRN
  Filled 2016-12-04: qty 5

## 2016-12-04 MED ORDER — DEXAMETHASONE SODIUM PHOSPHATE 10 MG/ML IJ SOLN
10.0000 mg | Freq: Once | INTRAMUSCULAR | Status: AC
  Administered 2016-12-05: 10 mg via INTRAVENOUS
  Filled 2016-12-04: qty 1

## 2016-12-04 MED ORDER — APIXABAN 2.5 MG PO TABS
2.5000 mg | ORAL_TABLET | Freq: Two times a day (BID) | ORAL | Status: DC
  Administered 2016-12-05 – 2016-12-06 (×3): 2.5 mg via ORAL
  Filled 2016-12-04 (×3): qty 1

## 2016-12-04 MED ORDER — ACETAMINOPHEN 10 MG/ML IV SOLN
1000.0000 mg | INTRAVENOUS | Status: AC
Start: 2016-12-04 — End: 2016-12-04
  Administered 2016-12-04: 1000 mg via INTRAVENOUS
  Filled 2016-12-04: qty 100

## 2016-12-04 MED ORDER — ISOSORB DINITRATE-HYDRALAZINE 20-37.5 MG PO TABS
1.0000 | ORAL_TABLET | Freq: Four times a day (QID) | ORAL | Status: DC
  Administered 2016-12-04 – 2016-12-06 (×6): 1 via ORAL
  Filled 2016-12-04 (×8): qty 1

## 2016-12-04 MED ORDER — TRANEXAMIC ACID 1000 MG/10ML IV SOLN
1000.0000 mg | Freq: Once | INTRAVENOUS | Status: AC
  Administered 2016-12-04: 1000 mg via INTRAVENOUS
  Filled 2016-12-04: qty 1100

## 2016-12-04 MED ORDER — CALCIUM-PHOSPHORUS-VITAMIN D 250-107-500 MG-MG-UNIT PO CHEW: CHEWABLE_TABLET | Freq: Two times a day (BID) | ORAL | Status: DC

## 2016-12-04 MED ORDER — DEXAMETHASONE SODIUM PHOSPHATE 10 MG/ML IJ SOLN
INTRAMUSCULAR | Status: AC
  Filled 2016-12-04: qty 1

## 2016-12-04 MED ORDER — SODIUM CHLORIDE 0.9 % IV SOLN: INTRAVENOUS | Status: DC

## 2016-12-04 MED ORDER — KETOROLAC TROMETHAMINE 30 MG/ML IJ SOLN
INTRAMUSCULAR | Status: AC
  Filled 2016-12-04: qty 1

## 2016-12-04 MED ORDER — DEXAMETHASONE SODIUM PHOSPHATE 10 MG/ML IJ SOLN
INTRAMUSCULAR | Status: DC | PRN
Start: 2016-12-04 — End: 2016-12-04
  Administered 2016-12-04: 10 mg via INTRAVENOUS

## 2016-12-04 MED ORDER — MENTHOL 3 MG MT LOZG: 1.0000 | LOZENGE | OROMUCOSAL | Status: DC | PRN

## 2016-12-04 MED ORDER — SODIUM CHLORIDE 0.9 % IJ SOLN
INTRAMUSCULAR | Status: DC | PRN
  Administered 2016-12-04: 29 mL

## 2016-12-04 MED ORDER — HYDROCHLOROTHIAZIDE 25 MG PO TABS
25.0000 mg | ORAL_TABLET | Freq: Every day | ORAL | Status: DC
  Administered 2016-12-05 – 2016-12-06 (×2): 25 mg via ORAL
  Filled 2016-12-04 (×2): qty 1

## 2016-12-04 MED ORDER — ONDANSETRON HCL 4 MG PO TABS: 4.0000 mg | ORAL_TABLET | Freq: Three times a day (TID) | ORAL | 0 refills | Status: DC | PRN

## 2016-12-04 MED ORDER — ONDANSETRON HCL 4 MG PO TABS
4.0000 mg | ORAL_TABLET | Freq: Four times a day (QID) | ORAL | Status: DC | PRN
  Administered 2016-12-05: 17:00:00 4 mg via ORAL
  Filled 2016-12-04: qty 1

## 2016-12-04 MED ORDER — SODIUM CHLORIDE 0.9 % IR SOLN
Status: DC | PRN
  Administered 2016-12-04: 4000 mL

## 2016-12-04 MED ORDER — VANCOMYCIN HCL IN DEXTROSE 1-5 GM/200ML-% IV SOLN
1000.0000 mg | Freq: Two times a day (BID) | INTRAVENOUS | Status: AC
  Administered 2016-12-05: 1000 mg via INTRAVENOUS
  Filled 2016-12-04: qty 200

## 2016-12-04 MED ORDER — HYDROMORPHONE HCL 1 MG/ML IJ SOLN
0.5000 mg | INTRAMUSCULAR | Status: DC | PRN
  Administered 2016-12-05: 01:00:00 1 mg via INTRAVENOUS
  Filled 2016-12-04: qty 1

## 2016-12-04 MED ORDER — MEPERIDINE HCL 50 MG/ML IJ SOLN: 6.2500 mg | INTRAMUSCULAR | Status: DC | PRN

## 2016-12-04 MED ORDER — KETOROLAC TROMETHAMINE 15 MG/ML IJ SOLN
7.5000 mg | Freq: Four times a day (QID) | INTRAMUSCULAR | Status: AC
  Administered 2016-12-04 – 2016-12-05 (×3): 7.5 mg via INTRAVENOUS
  Filled 2016-12-04 (×3): qty 1

## 2016-12-04 MED ORDER — POLYETHYLENE GLYCOL 3350 17 G PO PACK: 17.0000 g | PACK | Freq: Every day | ORAL | Status: DC | PRN

## 2016-12-04 MED ORDER — HYDROCODONE-ACETAMINOPHEN 5-325 MG PO TABS
1.0000 | ORAL_TABLET | ORAL | Status: DC | PRN
  Administered 2016-12-04 (×2): 1 via ORAL
  Administered 2016-12-05 – 2016-12-06 (×5): 2 via ORAL
  Filled 2016-12-04 (×5): qty 2
  Filled 2016-12-04: qty 1
  Filled 2016-12-04: qty 2

## 2016-12-04 MED ORDER — LOSARTAN POTASSIUM 50 MG PO TABS
100.0000 mg | ORAL_TABLET | Freq: Every day | ORAL | Status: DC
  Administered 2016-12-05 – 2016-12-06 (×2): 100 mg via ORAL
  Filled 2016-12-04 (×2): qty 2

## 2016-12-04 MED ORDER — ONDANSETRON HCL 4 MG/2ML IJ SOLN
INTRAMUSCULAR | Status: AC
  Filled 2016-12-04: qty 2

## 2016-12-04 MED ORDER — BUPIVACAINE HCL (PF) 0.5 % IJ SOLN
INTRAMUSCULAR | Status: AC
  Filled 2016-12-04: qty 30

## 2016-12-04 MED ORDER — BUPIVACAINE HCL (PF) 0.5 % IJ SOLN
INTRAMUSCULAR | Status: DC | PRN
  Administered 2016-12-04: 3 mL

## 2016-12-04 MED ORDER — LACTATED RINGERS IV SOLN
INTRAVENOUS | Status: DC
  Administered 2016-12-04 (×3): via INTRAVENOUS

## 2016-12-04 MED ORDER — APIXABAN 2.5 MG PO TABS: 2.5000 mg | ORAL_TABLET | Freq: Two times a day (BID) | ORAL | 0 refills | Status: DC

## 2016-12-04 MED ORDER — ALUM & MAG HYDROXIDE-SIMETH 200-200-20 MG/5ML PO SUSP: 30.0000 mL | ORAL | Status: DC | PRN

## 2016-12-04 MED ORDER — SENNA 8.6 MG PO TABS
2.0000 | ORAL_TABLET | Freq: Every day | ORAL | Status: DC
  Administered 2016-12-04 – 2016-12-05 (×2): 17.2 mg via ORAL
  Filled 2016-12-04 (×2): qty 2

## 2016-12-04 MED ORDER — TRANEXAMIC ACID 1000 MG/10ML IV SOLN
1000.0000 mg | INTRAVENOUS | Status: AC
  Administered 2016-12-04: 1000 mg via INTRAVENOUS
  Filled 2016-12-04: qty 10

## 2016-12-04 MED ORDER — METOCLOPRAMIDE HCL 5 MG/ML IJ SOLN: 5.0000 mg | Freq: Three times a day (TID) | INTRAMUSCULAR | Status: DC | PRN

## 2016-12-04 SURGICAL SUPPLY — 54 items
BAG ZIPLOCK 12X15 (MISCELLANEOUS) ×3 IMPLANT
BANDAGE ACE 4X5 VEL STRL LF (GAUZE/BANDAGES/DRESSINGS) ×3 IMPLANT
BANDAGE ACE 6X5 VEL STRL LF (GAUZE/BANDAGES/DRESSINGS) ×3 IMPLANT
BLADE SAW RECIPROCATING 77.5 (BLADE) ×3 IMPLANT
CAPT KNEE TRIATH TK-4 ×3 IMPLANT
CHLORAPREP W/TINT 26ML (MISCELLANEOUS) ×6 IMPLANT
CUFF TOURN SGL QUICK 34 (TOURNIQUET CUFF) ×2
CUFF TRNQT CYL 34X4X40X1 (TOURNIQUET CUFF) ×1 IMPLANT
DECANTER SPIKE VIAL GLASS SM (MISCELLANEOUS) ×6 IMPLANT
DERMABOND ADVANCED (GAUZE/BANDAGES/DRESSINGS) ×4
DERMABOND ADVANCED .7 DNX12 (GAUZE/BANDAGES/DRESSINGS) ×2 IMPLANT
DRAPE SHEET LG 3/4 BI-LAMINATE (DRAPES) ×6 IMPLANT
DRAPE U-SHAPE 47X51 STRL (DRAPES) ×3 IMPLANT
DRESSING AQUACEL AG SP 3.5X10 (GAUZE/BANDAGES/DRESSINGS) ×1 IMPLANT
DRSG AQUACEL AG SP 3.5X10 (GAUZE/BANDAGES/DRESSINGS) ×3
ELECT BLADE TIP CTD 4 INCH (ELECTRODE) ×3 IMPLANT
ELECT REM PT RETURN 15FT ADLT (MISCELLANEOUS) ×3 IMPLANT
EVACUATOR 1/8 PVC DRAIN (DRAIN) IMPLANT
GLOVE BIO SURGEON STRL SZ8.5 (GLOVE) ×9 IMPLANT
GLOVE BIOGEL PI IND STRL 7.5 (GLOVE) ×4 IMPLANT
GLOVE BIOGEL PI IND STRL 8.5 (GLOVE) ×1 IMPLANT
GLOVE BIOGEL PI INDICATOR 7.5 (GLOVE) ×8
GLOVE BIOGEL PI INDICATOR 8.5 (GLOVE) ×2
GLOVE SURG SS PI 7.5 STRL IVOR (GLOVE) ×6 IMPLANT
GOWN SPEC L3 XXLG W/TWL (GOWN DISPOSABLE) ×6 IMPLANT
GOWN STRL REUS W/ TWL XL LVL3 (GOWN DISPOSABLE) ×1 IMPLANT
GOWN STRL REUS W/TWL XL LVL3 (GOWN DISPOSABLE) ×2
HANDPIECE INTERPULSE COAX TIP (DISPOSABLE) ×2
HOOD PEEL AWAY FLYTE STAYCOOL (MISCELLANEOUS) ×6 IMPLANT
MARKER SKIN DUAL TIP RULER LAB (MISCELLANEOUS) ×3 IMPLANT
NEEDLE SPNL 18GX3.5 QUINCKE PK (NEEDLE) ×3 IMPLANT
PACK TOTAL KNEE CUSTOM (KITS) ×3 IMPLANT
PADDING CAST COTTON 6X4 STRL (CAST SUPPLIES) ×3 IMPLANT
POSITIONER SURGICAL ARM (MISCELLANEOUS) ×3 IMPLANT
SAW OSC TIP CART 19.5X105X1.3 (SAW) ×3 IMPLANT
SEALER BIPOLAR AQUA 6.0 (INSTRUMENTS) ×3 IMPLANT
SET HNDPC FAN SPRY TIP SCT (DISPOSABLE) ×1 IMPLANT
SET PAD KNEE POSITIONER (MISCELLANEOUS) ×3 IMPLANT
SPONGE LAP 18X18 X RAY DECT (DISPOSABLE) IMPLANT
SUCTION FRAZIER HANDLE 12FR (TUBING) ×2
SUCTION TUBE FRAZIER 12FR DISP (TUBING) ×1 IMPLANT
SUT MNCRL AB 3-0 PS2 18 (SUTURE) ×3 IMPLANT
SUT MON AB 2-0 CT1 36 (SUTURE) ×6 IMPLANT
SUT STRATAFIX PDO 1 14 VIOLET (SUTURE) ×2
SUT STRATFX PDO 1 14 VIOLET (SUTURE) ×1
SUT VIC AB 1 CT1 36 (SUTURE) ×9 IMPLANT
SUT VIC AB 2-0 CT1 27 (SUTURE) ×2
SUT VIC AB 2-0 CT1 TAPERPNT 27 (SUTURE) ×1 IMPLANT
SUTURE STRATFX PDO 1 14 VIOLET (SUTURE) ×1 IMPLANT
SYR 50ML LL SCALE MARK (SYRINGE) ×3 IMPLANT
TOWER CARTRIDGE SMART MIX (DISPOSABLE) IMPLANT
TRAY FOLEY CATH SILVER 14FR (SET/KITS/TRAYS/PACK) ×3 IMPLANT
WRAP KNEE MAXI GEL POST OP (GAUZE/BANDAGES/DRESSINGS) ×3 IMPLANT
YANKAUER SUCT BULB TIP 10FT TU (MISCELLANEOUS) ×3 IMPLANT

## 2016-12-04 NOTE — Anesthesia Postprocedure Evaluation (Signed)
Anesthesia Post Note  Patient: Faith Ritter  Procedure(s) Performed: Procedure(s) (LRB): RIGHT TOTAL KNEE ARTHROPLASTY WITH COMPUTER NAVIGATION (Right)  Patient location during evaluation: PACU Anesthesia Type: Spinal and Regional Level of consciousness: awake and alert Pain management: pain level controlled Vital Signs Assessment: post-procedure vital signs reviewed and stable Respiratory status: spontaneous breathing and respiratory function stable Cardiovascular status: blood pressure returned to baseline and stable Postop Assessment: no headache, no backache and spinal receding Anesthetic complications: no       Last Vitals:  Vitals:   12/04/16 1900 12/04/16 1915  BP: 131/71 131/88  Pulse: (!) 58 100  Resp: (!) 22 (!) 21  Temp:      Last Pain:  Vitals:   12/04/16 1441  TempSrc:   PainSc: 3                  Montez Hageman

## 2016-12-04 NOTE — Progress Notes (Signed)
Pt may go to room, per Dr Marcell Barlow.

## 2016-12-04 NOTE — Anesthesia Preprocedure Evaluation (Signed)
Anesthesia Evaluation  Patient identified by MRN, date of birth, ID band Patient awake    Reviewed: Allergy & Precautions, NPO status , Patient's Chart, lab work & pertinent test results  Airway Mallampati: II  TM Distance: >3 FB Neck ROM: Full    Dental no notable dental hx.    Pulmonary former smoker,    Pulmonary exam normal breath sounds clear to auscultation       Cardiovascular hypertension, Pt. on medications and Pt. on home beta blockers Normal cardiovascular exam Rhythm:Regular Rate:Normal     Neuro/Psych negative neurological ROS  negative psych ROS   GI/Hepatic negative GI ROS, Neg liver ROS,   Endo/Other  negative endocrine ROS  Renal/GU negative Renal ROS  negative genitourinary   Musculoskeletal negative musculoskeletal ROS (+)   Abdominal   Peds negative pediatric ROS (+)  Hematology negative hematology ROS (+)   Anesthesia Other Findings   Reproductive/Obstetrics negative OB ROS                            Anesthesia Physical Anesthesia Plan  ASA: II  Anesthesia Plan: Spinal   Post-op Pain Management:  Regional for Post-op pain   Induction:   Airway Management Planned: Simple Face Mask  Additional Equipment:   Intra-op Plan:   Post-operative Plan:   Informed Consent: I have reviewed the patients History and Physical, chart, labs and discussed the procedure including the risks, benefits and alternatives for the proposed anesthesia with the patient or authorized representative who has indicated his/her understanding and acceptance.   Dental advisory given  Plan Discussed with: CRNA  Anesthesia Plan Comments:         Anesthesia Quick Evaluation

## 2016-12-04 NOTE — Progress Notes (Signed)
AssistedDr. Carignan with right, ultrasound guided, adductor canal block. Side rails up, monitors on throughout procedure. See vital signs in flow sheet. Tolerated Procedure well.  

## 2016-12-04 NOTE — H&P (View-Only) (Signed)
TOTAL KNEE ADMISSION H&P  Patient is being admitted for right total knee arthroplasty.  Subjective:  Chief Complaint:right knee pain.  HPI: Faith Ritter, 81 y.o. female, has a history of pain and functional disability in the right knee due to arthritis and has failed non-surgical conservative treatments for greater than 12 weeks to includeNSAID's and/or analgesics, corticosteriod injections, viscosupplementation injections, flexibility and strengthening excercises, use of assistive devices, weight reduction as appropriate and activity modification.  Onset of symptoms was gradual, starting 3 years ago with gradually worsening course since that time. The patient noted no past surgery on the right knee(s).  Patient currently rates pain in the right knee(s) at 10 out of 10 with activity. Patient has night pain, worsening of pain with activity and weight bearing, pain that interferes with activities of daily living, pain with passive range of motion, crepitus and joint swelling.  Patient has evidence of subchondral cysts, subchondral sclerosis, periarticular osteophytes and joint space narrowing by imaging studies. There is no active infection.  Patient Active Problem List   Diagnosis Date Noted  . Cervical myelopathy ([place]) 10/03/2013  . Shingles 10/03/2013   Past Medical History:  Diagnosis Date  . Hypercholesteremia   . Hypertension     Past Surgical History:  Procedure Laterality Date  . CATARACT EXTRACTION Bilateral   . FOOT SURGERY Right      (Not in a hospital admission) Allergies  Allergen Reactions  . Ciprofloxacin Hcl     "makes hands and feet cold"  . Sulfa Antibiotics Hives  . Penicillins Hives and Rash    Social History  Substance Use Topics  . Smoking status: Former Smoker    Types: Cigarettes  . Smokeless tobacco: Never Used     Comment: Quit 30 years ago  . Alcohol use No     Comment: occasionally    Family History  Problem Relation Age of Onset  . Stroke  Mother   . Heart attack Father      Review of Systems  Constitutional: Negative.   HENT: Negative.   Eyes: Negative.   Respiratory: Negative.   Cardiovascular: Negative.   Gastrointestinal: Negative.   Genitourinary: Negative.   Musculoskeletal: Positive for joint pain.  Skin: Negative.   Neurological: Negative.   Endo/Heme/Allergies: Negative.   Psychiatric/Behavioral: Negative.     Objective:  Physical Exam  Vitals reviewed. Constitutional: She is oriented to person, place, and time. She appears well-developed and well-nourished.  HENT:  Head: Normocephalic and atraumatic.  Eyes: Conjunctivae and EOM are normal. Pupils are equal, round, and reactive to light.  Neck: Normal range of motion. Neck supple. No thyromegaly present.  Cardiovascular: Normal rate, regular rhythm and intact distal pulses.   Respiratory: Effort normal and breath sounds normal. No respiratory distress.  GI: Soft. Bowel sounds are normal. She exhibits no distension.  Genitourinary:  Genitourinary Comments: deferred  Musculoskeletal:       Right knee: She exhibits decreased range of motion and swelling. Tenderness found. Medial joint line tenderness noted.  Neurological: She is alert and oriented to person, place, and time. She has normal reflexes.  Skin: Skin is warm and dry.  Psychiatric: She has a normal mood and affect. Her behavior is normal. Judgment and thought content normal.    Vital signs in last 24 hours: @VSRANGES @  Labs:   Estimated body mass index is 28.97 kg/m as calculated from the following:   Height as of 09/22/15: 5\' 4"  (1.626 m).   Weight as of 09/22/15: 76.6  kg (168 lb 12.8 oz).   Imaging Review Plain radiographs demonstrate severe degenerative joint disease of the right knee(s). The overall alignment issignificant varus. The bone quality appears to be adequate for age and reported activity level.  Assessment/Plan:  End stage arthritis, right knee   The patient  history, physical examination, clinical judgment of the provider and imaging studies are consistent with end stage degenerative joint disease of the right knee(s) and total knee arthroplasty is deemed medically necessary. The treatment options including medical management, injection therapy arthroscopy and arthroplasty were discussed at length. The risks and benefits of total knee arthroplasty were presented and reviewed. The risks due to aseptic loosening, infection, stiffness, patella tracking problems, thromboembolic complications and other imponderables were discussed. The patient acknowledged the explanation, agreed to proceed with the plan and consent was signed. Patient is being admitted for inpatient treatment for surgery, pain control, PT, OT, prophylactic antibiotics, VTE prophylaxis, progressive ambulation and ADL's and discharge planning. The patient is planning to be discharged home with outpatient PT. Already has DME.

## 2016-12-04 NOTE — Interval H&P Note (Signed)
History and Physical Interval Note:  12/04/2016 3:33 PM  Faith Ritter  has presented today for surgery, with the diagnosis of Degenerative joint disease right knee  The various methods of treatment have been discussed with the patient and family. After consideration of risks, benefits and other options for treatment, the patient has consented to  Procedure(s) with comments: RIGHT TOTAL KNEE ARTHROPLASTY WITH COMPUTER NAVIGATION (Right) - Needs RNFA as a surgical intervention .  The patient's history has been reviewed, patient examined, no change in status, stable for surgery.  I have reviewed the patient's chart and labs.  Questions were answered to the patient's satisfaction.     Lindon Kiel, Horald Pollen

## 2016-12-04 NOTE — Op Note (Signed)
OPERATIVE REPORT  SURGEON: Rod Can, MD   ASSISTANT: Staff.  PREOPERATIVE DIAGNOSIS: Right knee arthritis.   POSTOPERATIVE DIAGNOSIS: Right knee arthritis.   PROCEDURE: Right total knee arthroplasty.   IMPLANTS: Stryker Triathlon CR femur, size 4. Stryker Tritanium tibia, size 4. X3 polyethelyene insert, size 11 mm, CR. 3 button asymmetric patella, size 32 mm.  ANESTHESIA:  Regional and Spinal  TOURNIQUET TIME: Not utilized.   ESTIMATED BLOOD LOSS: 200 mL.  ANTIBIOTICS: 1 g vancomycin.  DRAINS: None.  COMPLICATIONS: None   CONDITION: PACU - hemodynamically stable.   BRIEF CLINICAL NOTE: Faith Ritter is a 81 y.o. female with a long-standing history of Right knee arthritis. After failing conservative management, the patient was indicated for total knee arthroplasty. The risks, benefits, and alternatives to the procedure were explained, and the patient elected to proceed.  PROCEDURE IN DETAIL: Adductor canal block was obtained in the pre-op holding area. Once inside the operative room, spinal anesthesia was obtained, and a foley catheter was inserted. The patient was then positioned, a nonsterile tourniquet was placed, and the lower extremity was prepped and draped in the normal sterile surgical fashion. A time-out was called verifying side and site of surgery. The patient received IV antibiotics within 60 minutes of beginning the procedure. The tourniquet was not utilized.  An anterior approach to the knee was performed utilizing a midvastus arthrotomy. A medial release was performed and the patellar fat pad was excised. Stryker navigation was used to cut the distal femur perpendicular to the mechanical axis. A freehand patellar resection was performed, and the patella was sized an prepared with 3 lug holes.  Nagivation was used to make a neutral proximal tibia  resection, taking 8 mm of bone from the less affected lateral side with 3 degrees of slope. The menisci were excised. A spacer block was placed, and the alignment and balance in extension were confirmed.   The distal femur was sized using the 3-degree external rotation guide referencing the posterior femoral cortex. The appropriate 4-in-1 cutting block was pinned into place. Rotation was checked using Whiteside's line, the epicondylar axis, and then confirmed with a spacer block in flexion. The remaining femoral cuts were performed, taking care to protect the MCL.  The tibia was sized and the trial tray was pinned into place. The remaining trail components were inserted. The knee was stable to varus and valgus stress through a full range of motion. The patella tracked centrally, and the PCL was well balanced. The trial components were removed, and the proximal tibial surface was prepared. Final components were impacted into place. The knee was tested for a final time and found to be well balanced.  The wound was copiously irrigated with a dilute betadine solution followed by normal saline with pulse lavage. Marcaine solution was injected into the periarticular soft tissue. The wound was closed in layers using #1 Vicryl and Stratafix for the fascia, 2-0 Vicryl for the subcutaneous fat, 2-0 Monocryl for the deep dermal layer, 3-0 running Monocryl subcuticular Stitch, and Dermabond for the skin. Once the glue was fully dried, an Aquacell Ag and compressive dressing were applied. Tthe patient was transported to the recovery room in stable condition. Sponge, needle, and instrument counts were correct at the end of the case x2. The patient tolerated the procedure well and there were no known complications.

## 2016-12-04 NOTE — Transfer of Care (Signed)
Immediate Anesthesia Transfer of Care Note  Patient: Faith Ritter  Procedure(s) Performed: Procedure(s) with comments: RIGHT TOTAL KNEE ARTHROPLASTY WITH COMPUTER NAVIGATION (Right) - Adductor Block  Patient Location: PACU  Anesthesia Type:Spinal  Level of Consciousness: awake and alert   Airway & Oxygen Therapy: Patient Spontanous Breathing and Patient connected to face mask oxygen  Post-op Assessment: Report given to RN and Post -op Vital signs reviewed and stable  Post vital signs: Reviewed and stable  Last Vitals:  Vitals:   12/04/16 1615 12/04/16 1842  BP:    Pulse: 62 67  Resp: 10 19  Temp:      Last Pain:  Vitals:   12/04/16 1441  TempSrc:   PainSc: 3       Patients Stated Pain Goal: 2 (30/86/57 8469)  Complications: No apparent anesthesia complications

## 2016-12-04 NOTE — Anesthesia Procedure Notes (Addendum)
Anesthesia Regional Block: Adductor canal block   Pre-Anesthetic Checklist: ,, timeout performed, Correct Patient, Correct Site, Correct Laterality, Correct Procedure, Correct Position, site marked, Risks and benefits discussed,  Surgical consent,  Pre-op evaluation,  At surgeon's request and post-op pain management  Laterality: Right and Lower  Prep: Maximum Sterile Barrier Precautions used, chloraprep       Needles:  Injection technique: Single-shot  Needle Type: Echogenic Stimulator Needle     Needle Length: 10cm      Additional Needles:   Procedures: ultrasound guided,,,,,,,,  Narrative:  Start time: 12/04/2016 3:36 PM End time: 12/04/2016 3:46 PM Injection made incrementally with aspirations every 5 mL.  Performed by: Personally  Anesthesiologist: Montez Hageman  Additional Notes: Risks, benefits and alternative to block explained extensively.  Patient tolerated procedure well, without complications.

## 2016-12-04 NOTE — Anesthesia Procedure Notes (Signed)
Spinal  Patient location during procedure: OR End time: 12/04/2016 4:34 PM Staffing Resident/CRNA: Noralyn Pick D Performed: anesthesiologist and resident/CRNA  Preanesthetic Checklist Completed: patient identified, site marked, surgical consent, pre-op evaluation, timeout performed, IV checked, risks and benefits discussed and monitors and equipment checked Spinal Block Patient position: sitting Prep: Betadine Patient monitoring: heart rate, continuous pulse ox and blood pressure Approach: right paramedian Location: L2-3 Injection technique: single-shot Needle Needle type: Sprotte  Needle gauge: 24 G Needle length: 9 cm Assessment Sensory level: T6 Additional Notes Expiration date of kit checked and confirmed. Patient tolerated procedure well, without complications.

## 2016-12-05 ENCOUNTER — Encounter (HOSPITAL_COMMUNITY): Payer: Self-pay | Admitting: Orthopedic Surgery

## 2016-12-05 LAB — BASIC METABOLIC PANEL
ANION GAP: 5 (ref 5–15)
BUN: 19 mg/dL (ref 6–20)
CALCIUM: 8.6 mg/dL — AB (ref 8.9–10.3)
CHLORIDE: 106 mmol/L (ref 101–111)
CO2: 25 mmol/L (ref 22–32)
CREATININE: 0.94 mg/dL (ref 0.44–1.00)
GFR calc non Af Amer: 57 mL/min — ABNORMAL LOW (ref >60)
Glucose, Bld: 179 mg/dL — ABNORMAL HIGH (ref 65–99)
Potassium: 4.1 mmol/L (ref 3.5–5.1)
SODIUM: 136 mmol/L (ref 135–145)

## 2016-12-05 LAB — CBC
HEMATOCRIT: 31.5 % — AB (ref 36.0–46.0)
HEMOGLOBIN: 10.8 g/dL — AB (ref 12.0–15.0)
MCH: 31.2 pg (ref 26.0–34.0)
MCHC: 34.3 g/dL (ref 30.0–36.0)
MCV: 91 fL (ref 78.0–100.0)
Platelets: 209 10*3/uL (ref 150–400)
RBC: 3.46 MIL/uL — ABNORMAL LOW (ref 3.87–5.11)
RDW: 14 % (ref 11.5–15.5)
WBC: 8.3 10*3/uL (ref 4.0–10.5)

## 2016-12-05 NOTE — Progress Notes (Signed)
qPhysical Therapy Treatment Patient Details Name: Faith Ritter MRN: [id number] DOB: [birth year] Today's Date: 12/05/2016    History of Present Illness S/P R TKA.     PT Comments    Pt very motivated and progressing well with mobility.  Pt hopeful for dc home tomorrow.   Follow Up Recommendations  Home health PT     Equipment Recommendations  None recommended by PT    Recommendations for Other Services OT consult     Precautions / Restrictions Precautions Precautions: Fall;Knee Restrictions Weight Bearing Restrictions: No Other Position/Activity Restrictions: WBAT R LE     Mobility  Bed Mobility Overal bed mobility: Needs Assistance Bed Mobility: Sit to Supine     Supine to sit: Min guard Sit to supine: Min guard   General bed mobility comments: cues for use of L LE to self assist  Transfers Overall transfer level: Needs assistance Equipment used: Rolling walker (2 wheeled) Transfers: Sit to/from Stand Sit to Stand: Min guard         General transfer comment: Verbal cues for hand placement  Ambulation/Gait Ambulation/Gait assistance: Min guard;Supervision Ambulation Distance (Feet): 147 Feet Assistive device: Rolling walker (2 wheeled) Gait Pattern/deviations: Step-to pattern;Step-through pattern;Decreased step length - right;Decreased step length - left;Shuffle;Trunk flexed Gait velocity: decr Gait velocity interpretation: Below normal speed for age/gender General Gait Details: cues for sequence, posture and position from [employer]            Wheelchair Mobility    Modified Rankin (Stroke Patients Only)       Balance Overall balance assessment: Needs assistance         Standing balance support: During functional activity (with RW) Standing balance-Leahy Scale: Good                              Cognition Arousal/Alertness: Awake/alert Behavior During Therapy: WFL for tasks assessed/performed Overall Cognitive  Status: Within Functional Limits for tasks assessed                                        Exercises Total Joint Exercises Ankle Circles/Pumps: AROM;Both;15 reps;Supine Quad Sets: AROM;Both;15 reps;Supine Heel Slides: AAROM;Right;15 reps;Supine Straight Leg Raises: AAROM;AROM;Right;15 reps;Supine    General Comments        Pertinent Vitals/Pain Pain Assessment: 0-10 Pain Score: 4  Pain Location: R knee Pain Descriptors / Indicators: Sore Pain Intervention(s): Limited activity within patient's tolerance;Monitored during session;Premedicated before session;Ice applied    Home Living Family/patient expects to be discharged to:: Private residence Living Arrangements: Other (Comment) ([facility] living) Available Help at Discharge: Family Type of Home: House Home Access: Level entry   Home Layout: One level Home Equipment: Environmental consultant - 2 wheels Additional Comments: Pt lives in continuing care community which provides assistance PRN.     Prior Function Level of Independence: Independent          PT Goals (current goals can now be found in the care plan section) Acute Rehab PT Goals Patient Stated Goal: To be independent with ADLs and functional mobility.  PT Goal Formulation: With patient Time For Goal Achievement: 12/10/16 Potential to Achieve Goals: Good Progress towards PT goals: Progressing toward goals    Frequency    7X/week      PT Plan Current plan remains appropriate    Co-evaluation  End of Session Equipment Utilized During Treatment: Gait belt Activity Tolerance: Patient tolerated treatment well Patient left: in bed;with call bell/phone within reach;with family/visitor present Nurse Communication: Mobility status PT Visit Diagnosis: Difficulty in walking, not elsewhere classified (R26.2)     Time: 4982-6415 PT Time Calculation (min) (ACUTE ONLY): 28 min  Charges:  $Gait Training: 8-22 mins $Therapeutic Exercise:  8-22 mins                    G Codes:         Filimon Miranda 12/05/2016, 2:56 PM

## 2016-12-05 NOTE — Evaluation (Signed)
Physical Therapy Evaluation Patient Details Name: Faith Ritter MRN: [id number] DOB: 1935/10/11 Today's Date: 12/05/2016   History of Present Illness  S/P R TKA.   Clinical Impression  Pt s/p R TKR and presents with decreased R LE strength/ROM and post op pain limiting functional mobility.  Pt should progress to dc home with assist of family and HHPT follow up.    Follow Up Recommendations Home health PT    Equipment Recommendations  None recommended by PT    Recommendations for Other Services OT consult     Precautions / Restrictions Precautions Precautions: Fall;Knee Restrictions Weight Bearing Restrictions: No Other Position/Activity Restrictions: WBAT R LE       Mobility  Bed Mobility Overal bed mobility: Needs Assistance Bed Mobility: Supine to Sit     Supine to sit: Min guard     General bed mobility comments: cues for use of L LE to self assist  Transfers Overall transfer level: Needs assistance Equipment used: Rolling walker (2 wheeled) Transfers: Sit to/from Stand Sit to Stand: Min guard            Ambulation/Gait Ambulation/Gait assistance: Min assist;Min guard Ambulation Distance (Feet): 90 Feet Assistive device: Rolling walker (2 wheeled) Gait Pattern/deviations: Step-to pattern;Step-through pattern;Decreased step length - right;Decreased step length - left;Shuffle;Trunk flexed Gait velocity: decr Gait velocity interpretation: Below normal speed for age/gender General Gait Details: cues for sequence, posture and position from [employer]            Wheelchair Mobility    Modified Rankin (Stroke Patients Only)       Balance Overall balance assessment: Needs assistance Sitting-balance support: Feet supported Sitting balance-Leahy Scale: Good                                       Pertinent Vitals/Pain Pain Assessment: 0-10 Pain Score: 4  Pain Location: R knee Pain Descriptors / Indicators: Sore Pain  Intervention(s): Limited activity within patient's tolerance;Monitored during session;Premedicated before session;Ice applied    Home Living Family/patient expects to be discharged to:: Private residence Living Arrangements: Other (Comment) ([facility] living) Available Help at Discharge: Family Type of Home: House Home Access: Level entry     Home Layout: One level Home Equipment: Environmental consultant - 2 wheels Additional Comments: Pt lives in continuing care community which provides assistance PRN.     Prior Function Level of Independence: Independent               Hand Dominance        Extremity/Trunk Assessment   Upper Extremity Assessment Upper Extremity Assessment: Overall WFL for tasks assessed    Lower Extremity Assessment Lower Extremity Assessment: RLE deficits/detail RLE Deficits / Details: 3/5 quads with IND SLR and AAROM at knee -8 - 75       Communication   Communication: No difficulties  Cognition Arousal/Alertness: Awake/alert Behavior During Therapy: WFL for tasks assessed/performed Overall Cognitive Status: Within Functional Limits for tasks assessed                                        General Comments      Exercises Total Joint Exercises Ankle Circles/Pumps: AROM;Both;15 reps;Supine Quad Sets: AROM;Both;15 reps;Supine Heel Slides: AAROM;Right;15 reps;Supine Straight Leg Raises: AAROM;AROM;Right;15 reps;Supine   Assessment/Plan    PT Assessment Patient needs  continued PT services  PT Problem List Decreased strength;Decreased range of motion;Decreased activity tolerance;Decreased mobility;Decreased knowledge of use of DME;Pain       PT Treatment Interventions DME instruction;Gait training;Stair training;Functional mobility training;Therapeutic activities;Therapeutic exercise;Patient/family education    PT Goals (Current goals can be found in the Care Plan section)  Acute Rehab PT Goals Patient Stated Goal: To be independent  with ADLs and functional mobility.  PT Goal Formulation: With patient Time For Goal Achievement: 12/10/16 Potential to Achieve Goals: Good    Frequency 7X/week   Barriers to discharge        Co-evaluation               End of Session Equipment Utilized During Treatment: Gait belt Activity Tolerance: Patient tolerated treatment well Patient left: in bed;with call bell/phone within reach;with family/visitor present Nurse Communication: Mobility status PT Visit Diagnosis: Difficulty in walking, not elsewhere classified (R26.2)    Time: 4010-2725 PT Time Calculation (min) (ACUTE ONLY): 43 min   Charges:   PT Evaluation $PT Eval Low Complexity: 1 Procedure PT Treatments $Gait Training: 8-22 mins $Therapeutic Exercise: 8-22 mins   PT G Codes:          Somalia Segler 12/05/2016, 12:55 PM

## 2016-12-05 NOTE — Discharge Instructions (Addendum)
°Dr. Brian Swinteck °Total Joint Specialist °[facility] °[address] °[place], [place] [address] °[phone] ° °TOTAL KNEE REPLACEMENT POSTOPERATIVE DIRECTIONS ° ° ° °Knee Rehabilitation, Guidelines Following Surgery  °Results after knee surgery are often greatly improved when you follow the exercise, range of motion and muscle strengthening exercises prescribed by your doctor. Safety measures are also important to protect the knee from further injury. Any time any of these exercises cause you to have increased pain or swelling in your knee joint, decrease the amount until you are comfortable again and slowly increase them. If you have problems or questions, call your caregiver or physical therapist for advice.  ° °WEIGHT BEARING °Weight bearing as tolerated with assist device (walker, cane, etc) as directed, use it as long as suggested by your surgeon or therapist, typically at least 4-6 weeks. ° °HOME CARE INSTRUCTIONS  °Remove items at home which could result in a fall. This includes throw rugs or furniture in walking pathways.  °Continue medications as instructed at time of discharge. °You may have some home medications which will be placed on hold until you complete the course of blood thinner medication.  °You may start showering once you are discharged home but do not submerge the incision under water. Just pat the incision dry and apply a dry gauze dressing on daily. °Walk with walker as instructed.  °You may resume a sexual relationship in one month or when given the OK by your doctor.  °· Use walker as long as suggested by your caregivers. °· Avoid periods of inactivity such as sitting longer than an hour when not asleep. This helps prevent blood clots.  °You may put full weight on your legs and walk as much as is comfortable.  °You may return to work once you are cleared by your doctor.  °Do not drive a car for 6 weeks or until released by you surgeon.  °· Do not drive while  taking narcotics.  °Wear the elastic stockings for three weeks following surgery during the day but you may remove then at night. °Make sure you keep all of your appointments after your operation with all of your doctors and caregivers. You should call the office at the above phone number and make an appointment for approximately two weeks after the date of your surgery. °Do not remove your surgical dressing. The dressing is waterproof; you may take showers in 3 days, but do not take tub baths or submerge the dressing. °Please pick up a stool softener and laxative for home use as long as you are requiring pain medications. °· ICE to the affected knee every three hours for 30 minutes at a time and then as needed for pain and swelling.  Continue to use ice on the knee for pain and swelling from surgery. You may notice swelling that will progress down to the foot and ankle.  This is normal after surgery.  Elevate the leg when you are not up walking on it.   °It is important for you to complete the blood thinner medication as prescribed by your doctor. °· Continue to use the breathing machine which will help keep your temperature down.  It is common for your temperature to cycle up and down following surgery, especially at night when you are not up moving around and exerting yourself.  The breathing machine keeps your lungs expanded and your temperature down. ° °RANGE OF MOTION AND STRENGTHENING EXERCISES  °Rehabilitation of the knee is important following a   knee injury or an operation. After just a few days of immobilization, the muscles of the thigh which control the knee become weakened and shrink (atrophy). Knee exercises are designed to build up the tone and strength of the thigh muscles and to improve knee motion. Often times heat used for twenty to thirty minutes before working out will loosen up your tissues and help with improving the range of motion but do not use heat for the first two weeks following  surgery. These exercises can be done on a training (exercise) mat, on the floor, on a table or on a bed. Use what ever works the best and is most comfortable for you Knee exercises include:  Leg Lifts - While your knee is still immobilized in a splint or cast, you can do straight leg raises. Lift the leg to 60 degrees, hold for 3 sec, and slowly lower the leg. Repeat 10-20 times 2-3 times daily. Perform this exercise against resistance later as your knee gets better.  Quad and Hamstring Sets - Tighten up the muscle on the front of the thigh (Quad) and hold for 5-10 sec. Repeat this 10-20 times hourly. Hamstring sets are done by pushing the foot backward against an object and holding for 5-10 sec. Repeat as with quad sets.  A rehabilitation program following serious knee injuries can speed recovery and prevent re-injury in the future due to weakened muscles. Contact your doctor or a physical therapist for more information on knee rehabilitation.   SKILLED REHAB INSTRUCTIONS: If the patient is transferred to a skilled rehab facility following release from the hospital, a list of the current medications will be sent to the facility for the patient to continue.  When discharged from the skilled rehab facility, please have the facility set up the patient's [place] prior to being released. Also, the skilled facility will be responsible for providing the patient with their medications at time of release from the facility to include their pain medication, the muscle relaxants, and their blood thinner medication. If the patient is still at the rehab facility at time of the two week follow up appointment, the skilled rehab facility will also need to assist the patient in arranging follow up appointment in our office and any transportation needs.  MAKE SURE YOU:  Understand these instructions.  Will watch your condition.  Will get help right away if you are not doing well or get worse.     Pick up stool softner and laxative for home use following surgery while on pain medications. Do NOT remove your dressing. You may shower.  Do not take tub baths or submerge incision under water. May shower starting three days after surgery. Please use a clean towel to pat the incision dry following showers. Continue to use ice for pain and swelling after surgery. Do not use any lotions or creams on the incision until instructed by your surgeon.   Information on my medicine - ELIQUIS (apixaban)  This medication education was reviewed with me or my healthcare representative as part of my discharge preparation.  The pharmacist that spoke with me during my hospital stay was:  Tommie Raymond, Student-PharmD  Why was Eliquis prescribed for you? Eliquis was prescribed for you to reduce the risk of blood clots forming after orthopedic surgery.    What do You need to know about Eliquis? Take your Eliquis TWICE DAILY - one tablet in the morning and one tablet in the evening with or without food.  It would be best to take the dose about the same time each day.  If you have difficulty swallowing the tablet whole please discuss with your pharmacist how to take the medication safely.  Take Eliquis exactly as prescribed by your doctor and DO NOT stop taking Eliquis without talking to the doctor who prescribed the medication.  Stopping without other medication to take the place of Eliquis may increase your risk of developing a clot.  After discharge, you should have regular check-up appointments with your healthcare provider that is prescribing your Eliquis.  What do you do if you miss a dose? If a dose of ELIQUIS is not taken at the scheduled time, take it as soon as possible on the same day and twice-daily administration should be resumed.  The dose should not be doubled to make up for a missed dose.  Do not take more than one tablet of ELIQUIS at the same time.  Important Safety  Information A possible side effect of Eliquis is bleeding. You should call your healthcare provider right away if you experience any of the following: ? Bleeding from an injury or your nose that does not stop. ? Unusual colored urine (red or dark brown) or unusual colored stools (red or black). ? Unusual bruising for unknown reasons. ? A serious fall or if you hit your head (even if there is no bleeding).  Some medicines may interact with Eliquis and might increase your risk of bleeding or clotting while on Eliquis. To help avoid this, consult your healthcare provider or pharmacist prior to using any new prescription or non-prescription medications, including herbals, vitamins, non-steroidal anti-inflammatory drugs (NSAIDs) and supplements.  This website has more information on Eliquis (apixaban): http://www.eliquis.com/eliquis/home

## 2016-12-05 NOTE — Progress Notes (Signed)
   Subjective: 1 Day Post-Op Procedure(s) (LRB): RIGHT TOTAL KNEE ARTHROPLASTY WITH COMPUTER NAVIGATION (Right)  Pt doing fair Mild to moderate soreness this morning Slight tingling to her toes distally Denies any other symptoms Patient reports pain as moderate.  Objective:   VITALS:   Vitals:   12/05/16 0216 12/05/16 0535  BP: (!) 111/48 105/69  Pulse: 66 65  Resp: 17 18  Temp: 98.2 F (36.8 C) 98.1 F (36.7 C)    Right knee dressing intact nv intact distally No drainage    LABS  Recent Labs  12/05/16 0417  HGB 10.8*  HCT 31.5*  WBC 8.3  PLT 209     Recent Labs  12/05/16 0417  NA 136  K 4.1  BUN 19  CREATININE 0.94  GLUCOSE 179*     Assessment/Plan: 1 Day Post-Op Procedure(s) (LRB): RIGHT TOTAL KNEE ARTHROPLASTY WITH COMPUTER NAVIGATION (Right) PT/OT  Probable d/c tomorrow depending on progress Pain management Pulmonary toilet    Merla Riches, MPAS, PA-C  12/05/2016, 6:56 AM

## 2016-12-05 NOTE — Evaluation (Signed)
Occupational Therapy Evaluation Patient Details Name: Faith Ritter MRN: [id number] DOB: [birth year] Today's Date: 12/05/2016    History of Present Illness S/P R TKA.    Clinical Impression   Pt is a 81 y/o F who presents with the above. Pt currently resides in a continuing care retirement community. Pt will benefit from continued OT services to increase safety and independence with ADLs and functional mobility. Goals are for Independent to ModI.     Follow Up Recommendations  Supervision/Assistance - 24 hour    Equipment Recommendations  3 in 1 bedside commode (vs none)    Recommendations for Other Services       Precautions / Restrictions Precautions Precautions: Fall;Knee Restrictions Weight Bearing Restrictions: No Other Position/Activity Restrictions: WBAT R LE       Mobility Bed Mobility Overal bed mobility: Modified Independent             General bed mobility comments: HOB elevated  Transfers Overall transfer level: Needs assistance Equipment used: Rolling walker (2 wheeled) Transfers: Sit to/from Stand Sit to Stand: Min guard              Balance Overall balance assessment: Needs assistance Sitting-balance support: Feet supported Sitting balance-Leahy Scale: Good                                     ADL either performed or assessed with clinical judgement   ADL Overall ADL's : Needs assistance/impaired Eating/Feeding: Independent   Grooming: Wash/dry face;Applying deodorant;Supervision/safety;Sitting;Oral care;Min guard;Cueing for safety;Standing Grooming Details (indicate cue type and reason): Completed seated grooming EOB with supervision; standing grooming brushing teeth at sink using RW with [employer] and verbal cues for safety  Upper Body Bathing: Supervision/ safety;Sitting   Lower Body Bathing: Minimal assistance;Sit to/from stand   Upper Body Dressing : Set up;Sitting   Lower Body Dressing: Minimal assistance;Sit  to/from stand   Toilet Transfer: Min guard;RW   Toileting- Water quality scientist and Hygiene: Minimal assistance;Sit to/from stand   Tub/ Shower Transfer: Minimal assistance;Shower seat   Functional mobility during ADLs: Min guard  Performed ADL from EOB.       Vision         Perception     Praxis      Pertinent Vitals/Pain Pain Assessment: 0-10 Pain Score: 4  Pain Location: R knee Pain Descriptors / Indicators: Sore Pain Intervention(s): Limited activity within patient's tolerance;Monitored during session;RN gave pain meds during session;Ice applied     Hand Dominance     Extremity/Trunk Assessment Upper Extremity Assessment Upper Extremity Assessment: Overall WFL for tasks assessed           Communication Communication Communication: No difficulties   Cognition Arousal/Alertness: Awake/alert Behavior During Therapy: WFL for tasks assessed/performed Overall Cognitive Status: Within Functional Limits for tasks assessed                                     General Comments       Exercises     Shoulder Instructions      Home Living Family/patient expects to be discharged to:: Other (Comment) (Retirement (continuing care) Community)                                 Additional Comments: Pt lives  in continuing care community which provides assistance PRN.       Prior Functioning/Environment Level of Independence: Independent                 OT Problem List: Decreased strength;Decreased activity tolerance;Pain      OT Treatment/Interventions: Self-care/ADL training;Energy conservation;DME and/or AE instruction;Therapeutic activities    OT Goals(Current goals can be found in the care plan section) Acute Rehab OT Goals Patient Stated Goal: To be independent with ADLs and functional mobility.  OT Goal Formulation: With patient Time For Goal Achievement: 12/12/16 Potential to Achieve Goals: Good ADL Goals Pt Will  Transfer to Toilet: with modified independence;regular height toilet;grab bars Pt Will Perform Toileting - Clothing Manipulation and hygiene: with modified independence;sit to/from stand Pt Will Perform Tub/Shower Transfer: with supervision;shower seat;rolling walker Additional ADL Goal #1: Pt will independently demonstrate safe use of RW and t/f techniques during completion of ADLs and functional mobility.   OT Frequency: Min 2X/week   Barriers to D/C:            Co-evaluation              End of Session Equipment Utilized During Treatment: Gait belt;Rolling walker  Activity Tolerance: Patient tolerated treatment well Patient left: in chair;with chair alarm set;with family/visitor present (Daughter present at end of session)  OT Visit Diagnosis: Muscle weakness (generalized) (M62.81);Pain Pain - Right/Left: Right Pain - part of body: Knee                Time: 0812-0852 OT Time Calculation (min): 40 min Charges:  OT General Charges $OT Visit: 1 Procedure OT Evaluation $OT Eval Low Complexity: 1 Procedure OT Treatments $Self Care/Home Management : 8-22 mins G-Codes:     Faith Ritter, OTR/L [phone] 12/05/2016  Faith Ritter 12/05/2016, 9:46 AM

## 2016-12-05 NOTE — Care Management Note (Signed)
Case Management Note  Patient Details  Name: MEGHANA TULLO MRN: [id number] Date of Birth: 05/11/1936  Subjective/Objective:  81 y/o f admitted w/OA r knee. From [facility]-has rw. PT-recc HHPT-[facility] has own HHPT on site-they will provide PT @ facility. [facility] dme rep Kim aware of 3n1 to deliver to rm prior d/c in am. Patient has own transportation back to [place].                 Action/Plan:d/c plan home w/HHC/dme-3n1   Expected Discharge Date:                  Expected Discharge Plan:  [place]  In-House Referral:     Discharge planning Services  CM Consult  Post Acute Care Choice:  Durable Medical Equipment (rw) Choice offered to:  Patient  DME Arranged:  3-N-1 DME Agency:  [place]:  PT [place]:  Blinda Leatherwood at [place] of Service:  Completed, signed off  If discussed at [employer], dates discussed:    Additional Comments:  Dessa Phi, RN 12/05/2016, 2:20 PM

## 2016-12-05 NOTE — Progress Notes (Signed)
Occupational Therapy Treatment Patient Details Name: Faith Ritter MRN: [id number] DOB: 1935/12/21 Today's Date: 12/05/2016    History of present illness S/P R TKA.    OT comments  Pt making progress towards goals. Reviewed DME equipment and use of BSC vs none during treatment session. Pt would like 3:1 for home.   Continue per POC.    Follow Up Recommendations  Supervision/Assistance - 24 hour    Equipment Recommendations  3 in 1 bedside commode    Recommendations for Other Services      Precautions / Restrictions Precautions Precautions: Fall;Knee Restrictions Weight Bearing Restrictions: No Other Position/Activity Restrictions: WBAT R LE        Mobility Bed Mobility Overal bed mobility: Needs Assistance Bed Mobility: Supine to Sit     Supine to sit: Min guard     General bed mobility comments: cues for use of L LE to self assist  Transfers Overall transfer level: Needs assistance Equipment used: Rolling walker (2 wheeled) Transfers: Sit to/from Stand Sit to Stand: Min guard         General transfer comment: Verbal cues for hand placement    Balance Overall balance assessment: Needs assistance         Standing balance support: During functional activity (with RW) Standing balance-Leahy Scale: Good                             ADL either performed or assessed with clinical judgement   ADL                           Toilet Transfer: Min guard;BSC;RW (Completed toilet t/f with comfort height toilet and BSC)   Toileting- Clothing Manipulation and Hygiene: Min guard;Cueing for safety;Sit to/from stand       Functional mobility during ADLs: Min guard;Cueing for safety;Rolling walker (Verbal cues for safe techniques during use of RW) General ADL Comments: Pt completed toileting and transferred  to comfort height toilet and BSC. Reviewed and educated on safety during functional mobility with RW.     Vision       Perception      Praxis      Cognition Arousal/Alertness: Awake/alert Behavior During Therapy: WFL for tasks assessed/performed Overall Cognitive Status: Within Functional Limits for tasks assessed                                          Exercises    Shoulder Instructions       General Comments      Pertinent Vitals/ Pain       Pain Assessment: 0-10 Pain Score: 4  Pain Location: R knee Pain Descriptors / Indicators: Sore Pain Intervention(s): Limited activity within patient's tolerance;Monitored during session;Premedicated before session;Ice applied  Home Living Family/patient expects to be discharged to:: Private residence Living Arrangements: Other (Comment) ([facility] IND living) Available Help at Discharge: Family Type of Home: House Home Access: Level entry     Home Layout: One level               Home Equipment: Environmental consultant - 2 wheels   Additional Comments: Pt lives in continuing care community which provides assistance PRN.       Prior Functioning/Environment Level of Independence: Independent            Frequency  Min 2X/week        Progress Toward Goals  OT Goals(current goals can now be found in the care plan section)  Progress towards OT goals: Progressing toward goals  Acute Rehab OT Goals Patient Stated Goal: To be independent with ADLs and functional mobility.  OT Goal Formulation: With patient Time For Goal Achievement: 12/12/16 Potential to Achieve Goals: Good ADL Goals Pt Will Transfer to Toilet: with modified independence;regular height toilet;grab bars Pt Will Perform Toileting - Clothing Manipulation and hygiene: with modified independence;sit to/from stand Pt Will Perform Tub/Shower Transfer: with supervision;shower seat;rolling walker Additional ADL Goal #1: Pt will independently demonstrate safe use of RW and t/f techniques during completion of ADLs and functional mobility.   Plan Discharge plan remains appropriate     Co-evaluation                 End of Session Equipment Utilized During Treatment: Gait belt;Rolling walker  OT Visit Diagnosis: Muscle weakness (generalized) (M62.81);Pain Pain - Right/Left: Right Pain - part of body: Knee   Activity Tolerance Patient tolerated treatment well   Patient Left in chair;with call bell/phone within reach;with family/visitor present   Nurse Communication          Time: 1325-1340 OT Time Calculation (min): 15 min  Charges: OT General Charges $OT Visit: 1 Procedure OT Treatments $Self Care/Home Management : 8-22 mins  Lesle Chris, OTR/L [phone] 12/05/2016   [place] 12/05/2016, 2:32 PM

## 2016-12-06 LAB — CBC
HCT: 27.5 % — ABNORMAL LOW (ref 36.0–46.0)
Hemoglobin: 9.6 g/dL — ABNORMAL LOW (ref 12.0–15.0)
MCH: 31.5 pg (ref 26.0–34.0)
MCHC: 34.9 g/dL (ref 30.0–36.0)
MCV: 90.2 fL (ref 78.0–100.0)
PLATELETS: 215 10*3/uL (ref 150–400)
RBC: 3.05 MIL/uL — AB (ref 3.87–5.11)
RDW: 14 % (ref 11.5–15.5)
WBC: 11.7 10*3/uL — ABNORMAL HIGH (ref 4.0–10.5)

## 2016-12-06 NOTE — Progress Notes (Signed)
Discharged from floor via w/c for transport home by car. Belongings & family with pt. No changes in assessment. Faith Ritter  

## 2016-12-06 NOTE — Discharge Summary (Signed)
Physician Discharge Summary   Patient ID: [name] MRN: [id number] DOB/AGE: 04-24-1936 82 y.o.  Admit date: 12/04/2016 Discharge date: 12/06/2016  Admission Diagnoses:  Principal Problem:   Osteoarthritis of right knee   Discharge Diagnoses:  Same   Surgeries: Procedure(s): RIGHT TOTAL KNEE ARTHROPLASTY WITH COMPUTER NAVIGATION on 12/04/2016   Consultants: PT/OT  Discharged Condition: Stable  Hospital Course: Faith Ritter is an 81 y.o. female who was admitted 12/04/2016 with a chief complaint of right knee pain, and found to have a diagnosis of Osteoarthritis of right knee.  They were brought to the operating room on 12/04/2016 and underwent the above named procedures.    The patient had an uncomplicated hospital course and was stable for discharge.  Recent vital signs:  Vitals:   12/05/16 2115 12/06/16 0610  BP: 119/68 (!) 126/54  Pulse: 66 (!) 56  Resp: 16 16  Temp: 98.5 F (36.9 C) 98.6 F (37 C)    Recent laboratory studies:  Results for orders placed or performed during the hospital encounter of 12/04/16  CBC  Result Value Ref Range   WBC 8.3 4.0 - 10.5 K/uL   RBC 3.46 (L) 3.87 - 5.11 MIL/uL   Hemoglobin 10.8 (L) 12.0 - 15.0 g/dL   HCT 31.5 (L) 36.0 - 46.0 %   MCV 91.0 78.0 - 100.0 fL   MCH 31.2 26.0 - 34.0 pg   MCHC 34.3 30.0 - 36.0 g/dL   RDW 14.0 11.5 - 15.5 %   Platelets 209 150 - 400 K/uL  Basic metabolic panel  Result Value Ref Range   Sodium 136 135 - 145 mmol/L   Potassium 4.1 3.5 - 5.1 mmol/L   Chloride 106 101 - 111 mmol/L   CO2 25 22 - 32 mmol/L   Glucose, Bld 179 (H) 65 - 99 mg/dL   BUN 19 6 - 20 mg/dL   Creatinine, Ser 0.94 0.44 - 1.00 mg/dL   Calcium 8.6 (L) 8.9 - 10.3 mg/dL   GFR calc non Af Amer 57 (L) >60 mL/min   GFR calc Af Amer >60 >60 mL/min   Anion gap 5 5 - 15  CBC  Result Value Ref Range   WBC 11.7 (H) 4.0 - 10.5 K/uL   RBC 3.05 (L) 3.87 - 5.11 MIL/uL   Hemoglobin 9.6 (L) 12.0 - 15.0 g/dL   HCT 27.5 (L) 36.0 -  46.0 %   MCV 90.2 78.0 - 100.0 fL   MCH 31.5 26.0 - 34.0 pg   MCHC 34.9 30.0 - 36.0 g/dL   RDW 14.0 11.5 - 15.5 %   Platelets 215 150 - 400 K/uL    Discharge Medications:   Allergies as of 12/06/2016      Reactions   Ciprofloxacin Hcl    "makes hands and feet cold"   Sulfa Antibiotics Hives   Penicillins Hives, Swelling, Rash   Has patient had a PCN reaction causing immediate rash, facial/tongue/throat swelling, SOB or lightheadedness with hypotension: Yes Has patient had a PCN reaction causing severe rash involving mucus membranes or skin necrosis: unknown Has patient had a PCN reaction that required hospitalization No Has patient had a PCN reaction occurring within the last 10 years: No If all of the above answers are "NO", then may proceed with Cephalosporin use.      Medication List    STOP taking these medications   acetaminophen-codeine 300-30 MG tablet Commonly known as:  TYLENOL #3   aspirin 81 MG tablet   celecoxib  100 MG capsule Commonly known as:  CELEBREX   ibuprofen 200 MG tablet Commonly known as:  ADVIL,MOTRIN   ibuprofen 600 MG tablet Commonly known as:  ADVIL,MOTRIN     TAKE these medications   apixaban 2.5 MG Tabs tablet Commonly known as:  ELIQUIS Take 1 tablet (2.5 mg total) by mouth 2 (two) times daily.   CITRACAL +D3 PO Take 1 tablet by mouth 2 (two) times daily.   docusate sodium 100 MG capsule Commonly known as:  COLACE Take 1 capsule (100 mg total) by mouth 2 (two) times daily.   fenofibrate 48 MG tablet Commonly known as:  TRICOR Take 48 mg by mouth every evening. Reported on 09/22/2015   HYDROcodone-acetaminophen 5-325 MG tablet Commonly known as:  NORCO Take 1-2 tablets by mouth every 4 (four) hours as needed for moderate pain.   isosorbide-hydrALAZINE 20-37.5 MG tablet Commonly known as:  BIDIL Take 1 tablet by mouth 4 (four) times daily.   l-methylfolate-B6-B12 3-35-2 MG Tabs tablet Commonly known as:  METANX Take 1 tablet  by mouth 2 (two) times daily.   losartan-hydrochlorothiazide 100-25 MG tablet Commonly known as:  HYZAAR Take 1 tablet by mouth daily.   metoprolol succinate 50 MG 24 hr tablet Commonly known as:  TOPROL-XL Take 50 mg by mouth daily. Take with or immediately following a meal.   montelukast 10 MG tablet Commonly known as:  SINGULAIR Take 10 mg by mouth at bedtime as needed.   multivitamin capsule Take 1 capsule by mouth daily.   ondansetron 4 MG tablet Commonly known as:  ZOFRAN Take 1 tablet (4 mg total) by mouth every 8 (eight) hours as needed for nausea or vomiting.   senna 8.6 MG Tabs tablet Commonly known as:  SENOKOT Take 2 tablets (17.2 mg total) by mouth at bedtime.            Durable Medical Equipment        Start     Ordered   12/05/16 1414  For home use only DME 3 n 1  Once     12/05/16 1413      Diagnostic Studies: Dg Knee Right Port  Result Date: 12/04/2016 CLINICAL DATA:  Postoperative evaluation. EXAM: PORTABLE RIGHT KNEE - 1-2 VIEW COMPARISON:  RIGHT knee radiograph August 09, 2015 FINDINGS: Status post total knee arthroplasty with intact well seated femoral and tibial component non cemented components. Patellar prosthesis in place. No fracture deformity. No dislocation. No destructive bony lesions. Soft tissue swelling and subcutaneous gas consistent with recent surgery. IMPRESSION: Status post total knee arthroplasty. Electronically Signed   By: Elon Alas M.D.   On: 12/04/2016 19:26    Disposition: 01-Home or Self Care  Discharge Instructions    Call MD / Call 911    Complete by:  As directed    If you experience chest pain or shortness of breath, CALL 911 and be transported to the hospital emergency room.  If you develope a fever above 101 F, pus (white drainage) or increased drainage or redness at the wound, or calf pain, call your surgeon's office.   Constipation Prevention    Complete by:  As directed    Drink plenty of fluids.  Prune  juice may be helpful.  You may use a stool softener, such as Colace (over the counter) 100 mg twice a day.  Use MiraLax (over the counter) for constipation as needed.   Diet - low sodium heart healthy    Complete by:  As  directed    Increase activity slowly as tolerated    Complete by:  As directed       Follow-up Information    Yamilka Lopiccolo, Horald Pollen, MD. Schedule an appointment as soon as possible for a visit in 2 week(s).   Specialty:  Orthopedic Surgery Why:  For wound re-check Contact information: [address] [place] [address] [phone]        Inc[facility] - [facility] Follow up.   Why:  bedside commode Contact information: [place] [address] [phone]            Signed: Ventura Bruns 12/06/2016, 7:15 AM

## 2016-12-06 NOTE — Progress Notes (Signed)
qPhysical Therapy Treatment Patient Details Name: Faith Ritter MRN: [id number] DOB: 28-May-1936 Today's Date: 12/06/2016    History of Present Illness S/P R TKA.     PT Comments    Pt fatigued but progressing well with mobility and eager for dc home.  Reviewed home therex with written instructions provided.   Follow Up Recommendations  Home health PT     Equipment Recommendations  None recommended by PT    Recommendations for Other Services OT consult     Precautions / Restrictions Precautions Precautions: Fall;Knee Restrictions Weight Bearing Restrictions: No Other Position/Activity Restrictions: WBAT R LE     Mobility  Bed Mobility Overal bed mobility: Needs Assistance Bed Mobility: Supine to Sit     Supine to sit: Supervision;HOB elevated     General bed mobility comments: OOB with nursing and declines back to bed  Transfers Overall transfer level: Needs assistance Equipment used: Rolling walker (2 wheeled) Transfers: Sit to/from Stand Sit to Stand: Supervision         General transfer comment: cues for hand placement.  Ambulation/Gait Ambulation/Gait assistance: Supervision Ambulation Distance (Feet): 150 Feet Assistive device: Rolling walker (2 wheeled) Gait Pattern/deviations: Step-to pattern;Step-through pattern;Decreased step length - right;Decreased step length - left;Shuffle;Trunk flexed Gait velocity: decr Gait velocity interpretation: Below normal speed for age/gender General Gait Details: min cues for sequence, posture and position from [employer]            Wheelchair Mobility    Modified Rankin (Stroke Patients Only)       Balance                                            Cognition Arousal/Alertness: Awake/alert Behavior During Therapy: WFL for tasks assessed/performed Overall Cognitive Status: Within Functional Limits for tasks assessed                                         Exercises Total Joint Exercises Ankle Circles/Pumps: AROM;Both;15 reps;Supine Quad Sets: AROM;Both;15 reps;Supine Heel Slides: AAROM;Right;Supine;20 reps Straight Leg Raises: AAROM;AROM;Right;15 reps;Supine    General Comments        Pertinent Vitals/Pain Pain Assessment: 0-10 Pain Score: 4  Pain Location: R knee Pain Descriptors / Indicators: Sore Pain Intervention(s): Limited activity within patient's tolerance;Monitored during session;Premedicated before session;Ice applied    Home Living                      Prior Function            PT Goals (current goals can now be found in the care plan section) Acute Rehab PT Goals Patient Stated Goal: To be independent with ADLs and functional mobility.  PT Goal Formulation: With patient Time For Goal Achievement: 12/10/16 Potential to Achieve Goals: Good Progress towards PT goals: Progressing toward goals    Frequency    7X/week      PT Plan Current plan remains appropriate    Co-evaluation             End of Session Equipment Utilized During Treatment: Gait belt Activity Tolerance: Patient tolerated treatment well Patient left: in chair;with call bell/phone within reach;with family/visitor present Nurse Communication: Mobility status PT Visit Diagnosis: Difficulty in walking, not elsewhere classified (R26.2)  Time: 7290-2111 PT Time Calculation (min) (ACUTE ONLY): 27 min  Charges:  $Gait Training: 8-22 mins $Therapeutic Exercise: 8-22 mins                    G Codes:         Jamiya Nims 12/06/2016, 12:22 PM

## 2016-12-06 NOTE — Care Management Note (Signed)
Case Management Note  Patient Details  Name: Faith Ritter MRN: [id number] Date of Birth: 11/17/1935  Subjective/Objective:    R TKA                Action/Plan: Discharge Planning: Please see previous NCM notes. Spoke to pt and dtrs at bedside. Pt provided contact. Contacted [facility], Sherryll Burger # [phone] fax [phone]. Faxed orders and dc summary to [facility] and they will arrange HHPT. 3n1 delivered to room by [facility].    Expected Discharge Date:  12/06/16               Expected Discharge Plan:  [place]  In-House Referral:  NA  Discharge planning Services  CM Consult  Post Acute Care Choice:  Durable Medical Equipment (rw) Choice offered to:  Patient  DME Arranged:  3-N-1 DME Agency:  [place]:  PT [place]:  Blinda Leatherwood at [place] of Service:  Completed, signed off  If discussed at [employer], dates discussed:    Additional Comments:  Erenest Rasher, RN 12/06/2016, 11:26 AM

## 2016-12-06 NOTE — Progress Notes (Signed)
   Subjective: 2 Days Post-Op Procedure(s) (LRB): RIGHT TOTAL KNEE ARTHROPLASTY WITH COMPUTER NAVIGATION (Right)  Pt doing quite well Able to move and lift leg without difficulty Ready for d/c home later today  Denies any new symptoms or issues Patient reports pain as mild.  Objective:   VITALS:   Vitals:   12/05/16 2115 12/06/16 0610  BP: 119/68 (!) 126/54  Pulse: 66 (!) 56  Resp: 16 16  Temp: 98.5 F (36.9 C) 98.6 F (37 C)    Right knee incision healing well Dressing in place nv intact distally No rashes or edema  LABS  Recent Labs  12/05/16 0417 12/06/16 0416  HGB 10.8* 9.6*  HCT 31.5* 27.5*  WBC 8.3 11.7*  PLT 209 215     Recent Labs  12/05/16 0417  NA 136  K 4.1  BUN 19  CREATININE 0.94  GLUCOSE 179*     Assessment/Plan: 2 Days Post-Op Procedure(s) (LRB): RIGHT TOTAL KNEE ARTHROPLASTY WITH COMPUTER NAVIGATION (Right) D/c home after therapy today f/u in  2 weeks in the office  Pain management as needed PT/OT at home    [place], PA-C  12/06/2016, 7:13 AM

## 2016-12-06 NOTE — Progress Notes (Signed)
Occupational Therapy Treatment Patient Details Name: Faith Ritter MRN: [id number] DOB: May 08, 1936 Today's Date: 12/06/2016    History of present illness S/P R TKA.    OT comments  Pt doing well and is anticipating d/c for today. She states family can stay with her for a few days initially at d/c. Practiced shower transfer this visit and safety with walker use.    Follow Up Recommendations  Supervision/Assistance - 24 hour    Equipment Recommendations  3 in 1 bedside commode    Recommendations for Other Services      Precautions / Restrictions Precautions Precautions: Fall;Knee Restrictions Weight Bearing Restrictions: No Other Position/Activity Restrictions: WBAT R LE        Mobility Bed Mobility Overal bed mobility: Needs Assistance Bed Mobility: Supine to Sit     Supine to sit: Supervision;HOB elevated        Transfers Overall transfer level: Needs assistance Equipment used: Rolling walker (2 wheeled) Transfers: Sit to/from Stand Sit to Stand: Supervision         General transfer comment: cues for hand placement.    Balance                                           ADL either performed or assessed with clinical judgement   ADL                           Toilet Transfer: Min guard;Ambulation;RW       Tub/ Shower Transfer: Patent attorney ADL Comments: Pt used walker for functional mobility in room to retrieve toothbrush from the counter and transfer to the bathroom to brush her teeth. She needed min cues to make sure she fully backs up to the chair before letting go of the walker to sit down. Min cues also given to make sure she stays inside the walker at the sink and not push walker aside. Reviewed technique for LB dressing and pt states her family can also help her for a few days. Practiced shower transfer and issued handout. Gave instructions on placement of shower chair for safety and pt has a  hand held shower also. Pt did very well with step in and out of shower.      Vision Patient Visual Report: No change from baseline     Perception     Praxis      Cognition Arousal/Alertness: Awake/alert Behavior During Therapy: WFL for tasks assessed/performed Overall Cognitive Status: Within Functional Limits for tasks assessed                                          Exercises     Shoulder Instructions       General Comments      Pertinent Vitals/ Pain       Pain Assessment: 0-10 Pain Score: 3  Pain Location: R knee Pain Descriptors / Indicators: Sore Pain Intervention(s): Monitored during session;Ice applied  Home Living                                          Prior Functioning/Environment  Frequency  Min 2X/week        Progress Toward Goals  OT Goals(current goals can now be found in the care plan section)  Progress towards OT goals: Progressing toward goals     Plan Discharge plan remains appropriate    Co-evaluation                 End of Session Equipment Utilized During Treatment: Rolling walker  OT Visit Diagnosis: Muscle weakness (generalized) (M62.81);Pain Pain - Right/Left: Right Pain - part of body: Knee   Activity Tolerance Patient tolerated treatment well   Patient Left in chair;with call bell/phone within reach   Nurse Communication          Time: 0160-1093 OT Time Calculation (min): 21 min  Charges: OT General Charges $OT Visit: 1 Procedure OT Treatments $Therapeutic Activity: 8-22 mins     Jae Dire Nimrat Woolworth 12/06/2016, 8:54 AM

## 2016-12-08 DIAGNOSIS — R2689 Other abnormalities of gait and mobility: Secondary | ICD-10-CM | POA: Diagnosis not present

## 2016-12-08 DIAGNOSIS — Z471 Aftercare following joint replacement surgery: Secondary | ICD-10-CM | POA: Diagnosis not present

## 2016-12-08 DIAGNOSIS — M1711 Unilateral primary osteoarthritis, right knee: Secondary | ICD-10-CM | POA: Diagnosis not present

## 2016-12-08 DIAGNOSIS — M6281 Muscle weakness (generalized): Secondary | ICD-10-CM | POA: Diagnosis not present

## 2016-12-10 DIAGNOSIS — Z471 Aftercare following joint replacement surgery: Secondary | ICD-10-CM | POA: Diagnosis not present

## 2016-12-10 DIAGNOSIS — M1711 Unilateral primary osteoarthritis, right knee: Secondary | ICD-10-CM | POA: Diagnosis not present

## 2016-12-10 DIAGNOSIS — M6281 Muscle weakness (generalized): Secondary | ICD-10-CM | POA: Diagnosis not present

## 2016-12-10 DIAGNOSIS — R2689 Other abnormalities of gait and mobility: Secondary | ICD-10-CM | POA: Diagnosis not present

## 2016-12-12 DIAGNOSIS — M1711 Unilateral primary osteoarthritis, right knee: Secondary | ICD-10-CM | POA: Diagnosis not present

## 2016-12-12 DIAGNOSIS — M6281 Muscle weakness (generalized): Secondary | ICD-10-CM | POA: Diagnosis not present

## 2016-12-12 DIAGNOSIS — Z471 Aftercare following joint replacement surgery: Secondary | ICD-10-CM | POA: Diagnosis not present

## 2016-12-12 DIAGNOSIS — R2689 Other abnormalities of gait and mobility: Secondary | ICD-10-CM | POA: Diagnosis not present

## 2016-12-15 DIAGNOSIS — M6281 Muscle weakness (generalized): Secondary | ICD-10-CM | POA: Diagnosis not present

## 2016-12-15 DIAGNOSIS — Z471 Aftercare following joint replacement surgery: Secondary | ICD-10-CM | POA: Diagnosis not present

## 2016-12-15 DIAGNOSIS — R2689 Other abnormalities of gait and mobility: Secondary | ICD-10-CM | POA: Diagnosis not present

## 2016-12-15 DIAGNOSIS — M1711 Unilateral primary osteoarthritis, right knee: Secondary | ICD-10-CM | POA: Diagnosis not present

## 2016-12-17 DIAGNOSIS — R2689 Other abnormalities of gait and mobility: Secondary | ICD-10-CM | POA: Diagnosis not present

## 2016-12-17 DIAGNOSIS — Z471 Aftercare following joint replacement surgery: Secondary | ICD-10-CM | POA: Diagnosis not present

## 2016-12-17 DIAGNOSIS — M6281 Muscle weakness (generalized): Secondary | ICD-10-CM | POA: Diagnosis not present

## 2016-12-17 DIAGNOSIS — M1711 Unilateral primary osteoarthritis, right knee: Secondary | ICD-10-CM | POA: Diagnosis not present

## 2016-12-18 DIAGNOSIS — Z471 Aftercare following joint replacement surgery: Secondary | ICD-10-CM | POA: Diagnosis not present

## 2016-12-18 DIAGNOSIS — Z96651 Presence of right artificial knee joint: Secondary | ICD-10-CM | POA: Diagnosis not present

## 2016-12-19 DIAGNOSIS — R2689 Other abnormalities of gait and mobility: Secondary | ICD-10-CM | POA: Diagnosis not present

## 2016-12-19 DIAGNOSIS — Z471 Aftercare following joint replacement surgery: Secondary | ICD-10-CM | POA: Diagnosis not present

## 2016-12-19 DIAGNOSIS — M6281 Muscle weakness (generalized): Secondary | ICD-10-CM | POA: Diagnosis not present

## 2016-12-19 DIAGNOSIS — M1711 Unilateral primary osteoarthritis, right knee: Secondary | ICD-10-CM | POA: Diagnosis not present

## 2016-12-22 DIAGNOSIS — M1711 Unilateral primary osteoarthritis, right knee: Secondary | ICD-10-CM | POA: Diagnosis not present

## 2016-12-22 DIAGNOSIS — R2689 Other abnormalities of gait and mobility: Secondary | ICD-10-CM | POA: Diagnosis not present

## 2016-12-22 DIAGNOSIS — Z471 Aftercare following joint replacement surgery: Secondary | ICD-10-CM | POA: Diagnosis not present

## 2016-12-22 DIAGNOSIS — M6281 Muscle weakness (generalized): Secondary | ICD-10-CM | POA: Diagnosis not present

## 2016-12-26 DIAGNOSIS — M1711 Unilateral primary osteoarthritis, right knee: Secondary | ICD-10-CM | POA: Diagnosis not present

## 2016-12-26 DIAGNOSIS — M6281 Muscle weakness (generalized): Secondary | ICD-10-CM | POA: Diagnosis not present

## 2016-12-26 DIAGNOSIS — R2689 Other abnormalities of gait and mobility: Secondary | ICD-10-CM | POA: Diagnosis not present

## 2016-12-26 DIAGNOSIS — Z471 Aftercare following joint replacement surgery: Secondary | ICD-10-CM | POA: Diagnosis not present

## 2016-12-30 DIAGNOSIS — Z471 Aftercare following joint replacement surgery: Secondary | ICD-10-CM | POA: Diagnosis not present

## 2016-12-30 DIAGNOSIS — M6281 Muscle weakness (generalized): Secondary | ICD-10-CM | POA: Diagnosis not present

## 2016-12-30 DIAGNOSIS — R2689 Other abnormalities of gait and mobility: Secondary | ICD-10-CM | POA: Diagnosis not present

## 2016-12-30 DIAGNOSIS — M1711 Unilateral primary osteoarthritis, right knee: Secondary | ICD-10-CM | POA: Diagnosis not present

## 2017-01-02 DIAGNOSIS — M1711 Unilateral primary osteoarthritis, right knee: Secondary | ICD-10-CM | POA: Diagnosis not present

## 2017-01-02 DIAGNOSIS — M6281 Muscle weakness (generalized): Secondary | ICD-10-CM | POA: Diagnosis not present

## 2017-01-02 DIAGNOSIS — R2689 Other abnormalities of gait and mobility: Secondary | ICD-10-CM | POA: Diagnosis not present

## 2017-01-02 DIAGNOSIS — Z471 Aftercare following joint replacement surgery: Secondary | ICD-10-CM | POA: Diagnosis not present

## 2017-01-15 DIAGNOSIS — Z96651 Presence of right artificial knee joint: Secondary | ICD-10-CM | POA: Diagnosis not present

## 2017-01-15 DIAGNOSIS — Z471 Aftercare following joint replacement surgery: Secondary | ICD-10-CM | POA: Diagnosis not present

## 2017-02-16 DIAGNOSIS — J45909 Unspecified asthma, uncomplicated: Secondary | ICD-10-CM | POA: Diagnosis not present

## 2017-02-17 DIAGNOSIS — Z96651 Presence of right artificial knee joint: Secondary | ICD-10-CM | POA: Diagnosis not present

## 2017-02-17 DIAGNOSIS — Z471 Aftercare following joint replacement surgery: Secondary | ICD-10-CM | POA: Diagnosis not present

## 2017-03-26 DIAGNOSIS — M7051 Other bursitis of knee, right knee: Secondary | ICD-10-CM | POA: Diagnosis not present

## 2017-03-26 DIAGNOSIS — Z471 Aftercare following joint replacement surgery: Secondary | ICD-10-CM | POA: Diagnosis not present

## 2017-03-26 DIAGNOSIS — Z96651 Presence of right artificial knee joint: Secondary | ICD-10-CM | POA: Diagnosis not present

## 2017-03-30 DIAGNOSIS — M79661 Pain in right lower leg: Secondary | ICD-10-CM | POA: Diagnosis not present

## 2017-03-30 DIAGNOSIS — M6281 Muscle weakness (generalized): Secondary | ICD-10-CM | POA: Diagnosis not present

## 2017-04-01 DIAGNOSIS — M6281 Muscle weakness (generalized): Secondary | ICD-10-CM | POA: Diagnosis not present

## 2017-04-01 DIAGNOSIS — M79661 Pain in right lower leg: Secondary | ICD-10-CM | POA: Diagnosis not present

## 2017-04-03 DIAGNOSIS — M6281 Muscle weakness (generalized): Secondary | ICD-10-CM | POA: Diagnosis not present

## 2017-04-03 DIAGNOSIS — M79661 Pain in right lower leg: Secondary | ICD-10-CM | POA: Diagnosis not present

## 2017-04-06 DIAGNOSIS — M79661 Pain in right lower leg: Secondary | ICD-10-CM | POA: Diagnosis not present

## 2017-04-06 DIAGNOSIS — M6281 Muscle weakness (generalized): Secondary | ICD-10-CM | POA: Diagnosis not present

## 2017-04-08 DIAGNOSIS — M7051 Other bursitis of knee, right knee: Secondary | ICD-10-CM | POA: Diagnosis not present

## 2017-04-08 DIAGNOSIS — M79661 Pain in right lower leg: Secondary | ICD-10-CM | POA: Diagnosis not present

## 2017-04-10 DIAGNOSIS — M79661 Pain in right lower leg: Secondary | ICD-10-CM | POA: Diagnosis not present

## 2017-04-10 DIAGNOSIS — M7051 Other bursitis of knee, right knee: Secondary | ICD-10-CM | POA: Diagnosis not present

## 2017-04-13 DIAGNOSIS — M79661 Pain in right lower leg: Secondary | ICD-10-CM | POA: Diagnosis not present

## 2017-04-13 DIAGNOSIS — M7051 Other bursitis of knee, right knee: Secondary | ICD-10-CM | POA: Diagnosis not present

## 2017-04-15 DIAGNOSIS — M7051 Other bursitis of knee, right knee: Secondary | ICD-10-CM | POA: Diagnosis not present

## 2017-04-15 DIAGNOSIS — M79661 Pain in right lower leg: Secondary | ICD-10-CM | POA: Diagnosis not present

## 2017-04-16 DIAGNOSIS — Z0000 Encounter for general adult medical examination without abnormal findings: Secondary | ICD-10-CM | POA: Diagnosis not present

## 2017-04-16 DIAGNOSIS — I10 Essential (primary) hypertension: Secondary | ICD-10-CM | POA: Diagnosis not present

## 2017-04-16 DIAGNOSIS — Z23 Encounter for immunization: Secondary | ICD-10-CM | POA: Diagnosis not present

## 2017-04-16 DIAGNOSIS — M810 Age-related osteoporosis without current pathological fracture: Secondary | ICD-10-CM | POA: Diagnosis not present

## 2017-04-16 DIAGNOSIS — R739 Hyperglycemia, unspecified: Secondary | ICD-10-CM | POA: Diagnosis not present

## 2017-04-17 DIAGNOSIS — M7051 Other bursitis of knee, right knee: Secondary | ICD-10-CM | POA: Diagnosis not present

## 2017-04-17 DIAGNOSIS — M79661 Pain in right lower leg: Secondary | ICD-10-CM | POA: Diagnosis not present

## 2017-04-21 DIAGNOSIS — M7051 Other bursitis of knee, right knee: Secondary | ICD-10-CM | POA: Diagnosis not present

## 2017-04-21 DIAGNOSIS — M79661 Pain in right lower leg: Secondary | ICD-10-CM | POA: Diagnosis not present

## 2017-04-23 DIAGNOSIS — M79661 Pain in right lower leg: Secondary | ICD-10-CM | POA: Diagnosis not present

## 2017-04-23 DIAGNOSIS — M7051 Other bursitis of knee, right knee: Secondary | ICD-10-CM | POA: Diagnosis not present

## 2017-04-24 DIAGNOSIS — J301 Allergic rhinitis due to pollen: Secondary | ICD-10-CM | POA: Diagnosis not present

## 2017-04-24 DIAGNOSIS — Z0000 Encounter for general adult medical examination without abnormal findings: Secondary | ICD-10-CM | POA: Diagnosis not present

## 2017-04-24 DIAGNOSIS — M810 Age-related osteoporosis without current pathological fracture: Secondary | ICD-10-CM | POA: Diagnosis not present

## 2017-04-24 DIAGNOSIS — I10 Essential (primary) hypertension: Secondary | ICD-10-CM | POA: Diagnosis not present

## 2017-05-26 DIAGNOSIS — Z96651 Presence of right artificial knee joint: Secondary | ICD-10-CM | POA: Diagnosis not present

## 2017-06-18 DIAGNOSIS — R109 Unspecified abdominal pain: Secondary | ICD-10-CM | POA: Diagnosis not present

## 2017-06-18 DIAGNOSIS — R10819 Abdominal tenderness, unspecified site: Secondary | ICD-10-CM | POA: Diagnosis not present

## 2017-06-18 DIAGNOSIS — R1030 Lower abdominal pain, unspecified: Secondary | ICD-10-CM | POA: Diagnosis not present

## 2017-07-06 DIAGNOSIS — Z23 Encounter for immunization: Secondary | ICD-10-CM | POA: Diagnosis not present

## 2017-08-07 DIAGNOSIS — I2510 Atherosclerotic heart disease of native coronary artery without angina pectoris: Secondary | ICD-10-CM | POA: Diagnosis not present

## 2017-08-07 DIAGNOSIS — R079 Chest pain, unspecified: Secondary | ICD-10-CM | POA: Diagnosis not present

## 2017-08-07 DIAGNOSIS — I77810 Thoracic aortic ectasia: Secondary | ICD-10-CM | POA: Diagnosis not present

## 2017-08-07 DIAGNOSIS — I700 Atherosclerosis of aorta: Secondary | ICD-10-CM | POA: Diagnosis not present

## 2017-08-07 DIAGNOSIS — R7989 Other specified abnormal findings of blood chemistry: Secondary | ICD-10-CM | POA: Diagnosis not present

## 2017-08-07 DIAGNOSIS — R911 Solitary pulmonary nodule: Secondary | ICD-10-CM | POA: Diagnosis not present

## 2017-10-02 DIAGNOSIS — H26493 Other secondary cataract, bilateral: Secondary | ICD-10-CM | POA: Diagnosis not present

## 2017-10-13 DIAGNOSIS — L309 Dermatitis, unspecified: Secondary | ICD-10-CM | POA: Diagnosis not present

## 2017-10-13 DIAGNOSIS — D229 Melanocytic nevi, unspecified: Secondary | ICD-10-CM | POA: Diagnosis not present

## 2017-10-13 DIAGNOSIS — Z471 Aftercare following joint replacement surgery: Secondary | ICD-10-CM | POA: Diagnosis not present

## 2017-10-13 DIAGNOSIS — L814 Other melanin hyperpigmentation: Secondary | ICD-10-CM | POA: Diagnosis not present

## 2017-10-13 DIAGNOSIS — Z96651 Presence of right artificial knee joint: Secondary | ICD-10-CM | POA: Insufficient documentation

## 2017-10-13 DIAGNOSIS — L570 Actinic keratosis: Secondary | ICD-10-CM | POA: Diagnosis not present

## 2017-10-13 DIAGNOSIS — Z85820 Personal history of malignant melanoma of skin: Secondary | ICD-10-CM | POA: Diagnosis not present

## 2017-10-13 DIAGNOSIS — L821 Other seborrheic keratosis: Secondary | ICD-10-CM | POA: Diagnosis not present

## 2017-10-14 ENCOUNTER — Emergency Department (HOSPITAL_BASED_OUTPATIENT_CLINIC_OR_DEPARTMENT_OTHER): Payer: Medicare Other

## 2017-10-14 ENCOUNTER — Emergency Department (HOSPITAL_BASED_OUTPATIENT_CLINIC_OR_DEPARTMENT_OTHER)
Admission: EM | Admit: 2017-10-14 | Discharge: 2017-10-14 | Disposition: A | Discharge status: Home / Self Care | Payer: Medicare Other | Attending: Physician Assistant | Admitting: Physician Assistant

## 2017-10-14 ENCOUNTER — Other Ambulatory Visit: Payer: Self-pay

## 2017-10-14 ENCOUNTER — Encounter (HOSPITAL_BASED_OUTPATIENT_CLINIC_OR_DEPARTMENT_OTHER): Payer: Self-pay

## 2017-10-14 DIAGNOSIS — Z79899 Other long term (current) drug therapy: Secondary | ICD-10-CM | POA: Diagnosis not present

## 2017-10-14 DIAGNOSIS — R0789 Other chest pain: Secondary | ICD-10-CM | POA: Insufficient documentation

## 2017-10-14 DIAGNOSIS — I10 Essential (primary) hypertension: Secondary | ICD-10-CM | POA: Diagnosis not present

## 2017-10-14 DIAGNOSIS — Z87891 Personal history of nicotine dependence: Secondary | ICD-10-CM | POA: Diagnosis not present

## 2017-10-14 DIAGNOSIS — Z96651 Presence of right artificial knee joint: Secondary | ICD-10-CM | POA: Insufficient documentation

## 2017-10-14 DIAGNOSIS — R079 Chest pain, unspecified: Secondary | ICD-10-CM | POA: Diagnosis not present

## 2017-10-14 DIAGNOSIS — Z7982 Long term (current) use of aspirin: Secondary | ICD-10-CM | POA: Diagnosis not present

## 2017-10-14 DIAGNOSIS — R7989 Other specified abnormal findings of blood chemistry: Secondary | ICD-10-CM | POA: Diagnosis not present

## 2017-10-14 LAB — BASIC METABOLIC PANEL
Anion gap: 13 (ref 5–15)
BUN: 22 mg/dL — ABNORMAL HIGH (ref 6–20)
CALCIUM: 9.1 mg/dL (ref 8.9–10.3)
CO2: 22 mmol/L (ref 22–32)
CREATININE: 0.89 mg/dL (ref 0.44–1.00)
Chloride: 99 mmol/L — ABNORMAL LOW (ref 101–111)
GFR calc non Af Amer: >60 mL/min (ref >60)
Glucose, Bld: 103 mg/dL — ABNORMAL HIGH (ref 65–99)
Potassium: 3.6 mmol/L (ref 3.5–5.1)
SODIUM: 134 mmol/L — AB (ref 135–145)

## 2017-10-14 LAB — CBC
HCT: 37.8 % (ref 36.0–46.0)
Hemoglobin: 12.8 g/dL (ref 12.0–15.0)
MCH: 30.8 pg (ref 26.0–34.0)
MCHC: 33.9 g/dL (ref 30.0–36.0)
MCV: 91.1 fL (ref 78.0–100.0)
PLATELETS: 251 10*3/uL (ref 150–400)
RBC: 4.15 MIL/uL (ref 3.87–5.11)
RDW: 14.5 % (ref 11.5–15.5)
WBC: 6.4 10*3/uL (ref 4.0–10.5)

## 2017-10-14 LAB — TROPONIN I

## 2017-10-14 LAB — D-DIMER, QUANTITATIVE: D-Dimer, Quant: 1.16 ug/mL-FEU — ABNORMAL HIGH (ref 0.00–0.50)

## 2017-10-14 MED ORDER — NITROGLYCERIN 0.4 MG SL SUBL
0.4000 mg | SUBLINGUAL_TABLET | Freq: Once | SUBLINGUAL | Status: AC
  Administered 2017-10-14: 0.4 mg via SUBLINGUAL
  Filled 2017-10-14: qty 1

## 2017-10-14 MED ORDER — ACETAMINOPHEN 500 MG PO TABS
1000.0000 mg | ORAL_TABLET | Freq: Once | ORAL | Status: AC
  Administered 2017-10-14: 1000 mg via ORAL
  Filled 2017-10-14: qty 2

## 2017-10-14 MED ORDER — IOPAMIDOL (ISOVUE-370) INJECTION 76%
100.0000 mL | Freq: Once | INTRAVENOUS | Status: AC | PRN
  Administered 2017-10-14: 100 mL via INTRAVENOUS

## 2017-10-14 MED ORDER — ASPIRIN 81 MG PO CHEW
324.0000 mg | CHEWABLE_TABLET | Freq: Once | ORAL | Status: AC
  Administered 2017-10-14: 324 mg via ORAL
  Filled 2017-10-14: qty 4

## 2017-10-14 NOTE — ED Provider Notes (Signed)
[facility] EMERGENCY DEPARTMENT Provider Note   CSN: [id number] Arrival date & time: 10/14/17  1229     History   Chief Complaint Chief Complaint  Patient presents with  . Chest Pain    HPI Faith Ritter is a 82 y.o. female.  82 year old female with past medical history including hypertension, hyperlipidemia, PE, mitral valve prolapse who presents with chest pain.  This morning she began having sharp, intermittent non-exertional left sided chest pain. No N/V, SOB, or diaphoresis. No cough/cold sx, recent illness, fevers, recent travel.  No change in physical activity recently.  She reports distant h/o PE while pregnant and h/o melanoma.   FH notable for sister and mother w/ A fib and stroke, sister with CAD s/p stenting and CABG, brother with CABG.   The history is provided by the patient.  Chest Pain      Past Medical History:  Diagnosis Date  . Arthritis   . Heart murmur   . Hypercholesteremia   . Hypertension   . MVP (mitral valve prolapse)   . Pulmonary embolism during pregnancy with complication, antenatal    50 years ago     Patient Active Problem List   Diagnosis Date Noted  . Osteoarthritis of right knee 12/04/2016  . Cervical myelopathy ([place]) 10/03/2013  . Shingles 10/03/2013    Past Surgical History:  Procedure Laterality Date  . BREAST SURGERY     Benign L breast cyst  . CATARACT EXTRACTION Bilateral   . EYE SURGERY    . FOOT SURGERY Right   . KNEE ARTHROPLASTY Right 12/04/2016   Procedure: RIGHT TOTAL KNEE ARTHROPLASTY WITH COMPUTER NAVIGATION;  Surgeon: Rod Can, MD;  Location: [facility];  Service: Orthopedics;  Laterality: Right;  Adductor Block    OB History    No data available       Home Medications    Prior to Admission medications   Medication Sig Start Date End Date Taking? Authorizing Provider  aspirin EC 81 MG tablet Take 81 mg by mouth daily.   Yes [provider]  celecoxib (CELEBREX) 100 MG capsule  Take 100 mg by mouth 2 (two) times daily.   Yes [provider]  Calcium-Phosphorus-Vitamin D (CITRACAL +D3 PO) Take 1 tablet by mouth 2 (two) times daily.    [provider]  fenofibrate (TRICOR) 48 MG tablet Take 48 mg by mouth every evening. Reported on 09/22/2015    [provider]  isosorbide-hydrALAZINE (BIDIL) 20-37.5 MG per tablet Take 1 tablet by mouth 4 (four) times daily.     [provider]  l-methylfolate-B6-B12 (METANX) 3-35-2 MG TABS Take 1 tablet by mouth 2 (two) times daily.     [provider]  losartan-hydrochlorothiazide (HYZAAR) 100-25 MG per tablet Take 1 tablet by mouth daily.     [provider]  metoprolol succinate (TOPROL-XL) 50 MG 24 hr tablet Take 50 mg by mouth daily. Take with or immediately following a meal.    [provider]  montelukast (SINGULAIR) 10 MG tablet Take 10 mg by mouth at bedtime as needed.     [provider]  Multiple Vitamin (MULTIVITAMIN) capsule Take 1 capsule by mouth daily.    [provider]    Family History Family History  Problem Relation Age of Onset  . Stroke Mother   . Heart attack Father     Social History Social History   Tobacco Use  . Smoking status: Former Smoker    Packs/day: 0.50  Years: 30.00    Pack years: 15.00    Types: Cigarettes  . Smokeless tobacco: Never Used  . Tobacco comment: Quit 30 years ago  Substance Use Topics  . Alcohol use: Yes    Comment: occasionally  . Drug use: No     Allergies   Ciprofloxacin hcl; Sulfa antibiotics; and Penicillins   Review of Systems Review of Systems  Cardiovascular: Positive for chest pain.  All other systems reviewed and are negative except that which was mentioned in HPI    Physical Exam Updated Vital Signs BP (!) 146/64   Pulse (!) 57   Temp 98.7 F (37.1 C) (Oral)   Resp 18   Ht 5\' 4"  (1.626 m)   Wt 68 kg (150 lb)   SpO2 97%   BMI 25.75 kg/m   Physical Exam    Constitutional: She is oriented to person, place, and time. She appears well-developed and well-nourished. No distress.  HENT:  Head: Normocephalic and atraumatic.  Moist mucous membranes  Eyes: Conjunctivae are normal.  Neck: Neck supple.  Cardiovascular: Normal rate and regular rhythm.  Murmur heard.  Systolic murmur is present. Tenderness to left lateral chest wall with no crepitus  Pulmonary/Chest: Effort normal and breath sounds normal.  Abdominal: Soft. Bowel sounds are normal. She exhibits no distension. There is no tenderness.  Musculoskeletal: She exhibits no edema.  Neurological: She is alert and oriented to person, place, and time.  Fluent speech  Skin: Skin is warm and dry.  Psychiatric: She has a normal mood and affect. Judgment normal.  Nursing note and vitals reviewed.    ED Treatments / Results  Labs (all labs ordered are listed, but only abnormal results are displayed) Labs Reviewed  BASIC METABOLIC PANEL - Abnormal; Notable for the following components:      Result Value   Sodium 134 (*)    Chloride 99 (*)    Glucose, Bld 103 (*)    BUN 22 (*)    All other components within normal limits  D-DIMER, QUANTITATIVE (NOT AT [facility]) - Abnormal; Notable for the following components:   D-Dimer, Quant 1.16 (*)    All other components within normal limits  CBC  TROPONIN I  TROPONIN I    EKG  EKG Interpretation  Date/Time:  Wednesday October 14 2017 12:38:51 EST Ventricular Rate:  66 PR Interval:  202 QRS Duration: 82 QT Interval:  404 QTC Calculation: 423 R Axis:   81 Text Interpretation:  Sinus rhythm with Premature supraventricular complexes Otherwise normal ECG occasional PVC otherwise similar to previous Confirmed by Theotis Burrow [phone]) on 10/14/2017 1:34:58 PM Also confirmed by Theotis Burrow [phone]), editor Lynder Parents [phone])  on 10/14/2017 1:52:20 PM       Radiology Dg Chest 2 View  Result Date: 10/14/2017 CLINICAL DATA:  Chest pain. EXAM:  CHEST  2 VIEW COMPARISON:  CT scan and chest x-ray dated 08/07/2017 FINDINGS: Heart size and pulmonary vascularity are normal. Slight tortuosity of the thoracic aorta. Lungs are clear. No effusions. Accentuation of the thoracic kyphosis with osteopenia. IMPRESSION: No active cardiopulmonary disease. Electronically Signed   By: Lorriane Shire M.D.   On: 10/14/2017 13:01   Ct Angio Chest Pe W/cm &/or Wo Cm  Result Date: 10/14/2017 CLINICAL DATA:  Chest pain beginning today with elevated D-dimer. EXAM: CT ANGIOGRAPHY CHEST WITH CONTRAST TECHNIQUE: Multidetector CT imaging of the chest was performed using the standard protocol during bolus administration of intravenous contrast. Multiplanar CT image reconstructions and MIPs were  obtained to evaluate the vascular anatomy. CONTRAST:  145mL ISOVUE-370 IOPAMIDOL (ISOVUE-370) INJECTION 76% COMPARISON:  Radiography same day.  CT 08/07/2017. FINDINGS: Cardiovascular: Pulmonary arterial opacification is excellent. There are no pulmonary emboli. There is aortic atherosclerosis with tortuosity but no focal aneurysm or dissection. There is coronary artery calcification. Heart size is normal. Mediastinum/Nodes: No mediastinal or hilar mass or lymphadenopathy. Lungs/Pleura: No pleural effusion.  The lungs are clear. Upper Abdomen: Negative Musculoskeletal: Old healed rib fractures on the right. Chronic thoracic spondylosis. No acute bone finding. Review of the MIP images confirms the above findings. IMPRESSION: No change from previous study. No acute finding. No pulmonary emboli. Lungs clear. Aortic Atherosclerosis (ICD10-I70.0). Coronary artery calcification also present. Electronically Signed   By: Nelson Chimes M.D.   On: 10/14/2017 15:40    Procedures Procedures (including critical care time)  Medications Ordered in ED Medications  aspirin chewable tablet 324 mg (324 mg Oral Given 10/14/17 1425)  nitroGLYCERIN (NITROSTAT) SL tablet 0.4 mg (0.4 mg Sublingual Given 10/14/17  1426)  acetaminophen (TYLENOL) tablet 1,000 mg (1,000 mg Oral Given 10/14/17 1436)  iopamidol (ISOVUE-370) 76 % injection 100 mL (100 mLs Intravenous Contrast Given 10/14/17 1522)     Initial Impression / Assessment and Plan / ED Course  I have reviewed the triage vital signs and the nursing notes.  Pertinent labs & imaging results that were available during my care of the patient were reviewed by me and considered in my medical decision making (see chart for details).     Patient with nonexertional left-sided chest pain that began this morning and has been intermittent.  No improvement with nitroglycerin here.  She does have an area of reproducible tenderness on exam.  EKG without acute ischemia, similar to previous.  Lab work shows negative initial troponin, normal basic labs, d-dimer elevated therefore obtained CTA of chest which was negative for PE.  The patient's description is very atypical for ACS but she does have several risk factors for cardiac disease.  I contacted her primary cardiologist, Dr. Einar Gip, discussed her presentation.  He felt that because she has had a negative stress test within the last year and her workup has been reassuring here, she can be discharged home with follow-up in his clinic.  I have discussed this plan with the patient.  We will obtain a second troponin and if negative she will be discharged home.  She understands return precautions.  Final Clinical Impressions(s) / ED Diagnoses   Final diagnoses:  Atypical chest pain    ED Discharge Orders    None       Anysa Tacey, Wenda Overland, MD 10/14/17 1630

## 2017-10-14 NOTE — ED Triage Notes (Signed)
C/o CP x today-NAD-steady gait

## 2017-10-22 DIAGNOSIS — I10 Essential (primary) hypertension: Secondary | ICD-10-CM | POA: Diagnosis not present

## 2017-10-22 DIAGNOSIS — R0789 Other chest pain: Secondary | ICD-10-CM | POA: Diagnosis not present

## 2017-10-22 DIAGNOSIS — E782 Mixed hyperlipidemia: Secondary | ICD-10-CM | POA: Diagnosis not present

## 2017-11-12 DIAGNOSIS — I10 Essential (primary) hypertension: Secondary | ICD-10-CM | POA: Diagnosis not present

## 2017-11-19 DIAGNOSIS — I10 Essential (primary) hypertension: Secondary | ICD-10-CM | POA: Diagnosis not present

## 2017-11-19 DIAGNOSIS — R0789 Other chest pain: Secondary | ICD-10-CM | POA: Diagnosis not present

## 2017-11-19 DIAGNOSIS — E782 Mixed hyperlipidemia: Secondary | ICD-10-CM | POA: Diagnosis not present

## 2017-11-23 ENCOUNTER — Other Ambulatory Visit (HOSPITAL_COMMUNITY): Payer: Self-pay | Admitting: Cardiology

## 2017-11-23 DIAGNOSIS — I159 Secondary hypertension, unspecified: Secondary | ICD-10-CM

## 2017-12-02 DIAGNOSIS — M7051 Other bursitis of knee, right knee: Secondary | ICD-10-CM | POA: Diagnosis not present

## 2017-12-02 DIAGNOSIS — M25561 Pain in right knee: Secondary | ICD-10-CM | POA: Diagnosis not present

## 2017-12-02 DIAGNOSIS — Z96651 Presence of right artificial knee joint: Secondary | ICD-10-CM | POA: Diagnosis not present

## 2017-12-02 DIAGNOSIS — Z471 Aftercare following joint replacement surgery: Secondary | ICD-10-CM | POA: Diagnosis not present

## 2017-12-05 ENCOUNTER — Ambulatory Visit
Admission: RE | Admit: 2017-12-05 | Discharge: 2017-12-05 | Disposition: A | Discharge status: Home / Self Care | Payer: Medicare Other | Source: Ambulatory Visit | Attending: Cardiology | Admitting: Cardiology

## 2017-12-05 ENCOUNTER — Other Ambulatory Visit (HOSPITAL_COMMUNITY): Payer: Self-pay | Admitting: Cardiology

## 2017-12-05 DIAGNOSIS — K7689 Other specified diseases of liver: Secondary | ICD-10-CM | POA: Diagnosis not present

## 2017-12-05 DIAGNOSIS — I159 Secondary hypertension, unspecified: Secondary | ICD-10-CM

## 2017-12-05 DIAGNOSIS — N281 Cyst of kidney, acquired: Secondary | ICD-10-CM | POA: Diagnosis not present

## 2017-12-05 DIAGNOSIS — K449 Diaphragmatic hernia without obstruction or gangrene: Secondary | ICD-10-CM | POA: Diagnosis not present

## 2017-12-05 MED ORDER — GADOBENATE DIMEGLUMINE 529 MG/ML IV SOLN
14.0000 mL | Freq: Once | INTRAVENOUS | Status: AC | PRN
  Administered 2017-12-05: 14 mL via INTRAVENOUS

## 2017-12-08 DIAGNOSIS — R2689 Other abnormalities of gait and mobility: Secondary | ICD-10-CM | POA: Diagnosis not present

## 2017-12-08 DIAGNOSIS — M6281 Muscle weakness (generalized): Secondary | ICD-10-CM | POA: Diagnosis not present

## 2017-12-08 DIAGNOSIS — M25561 Pain in right knee: Secondary | ICD-10-CM | POA: Diagnosis not present

## 2017-12-11 DIAGNOSIS — M6281 Muscle weakness (generalized): Secondary | ICD-10-CM | POA: Diagnosis not present

## 2017-12-11 DIAGNOSIS — R2689 Other abnormalities of gait and mobility: Secondary | ICD-10-CM | POA: Diagnosis not present

## 2017-12-11 DIAGNOSIS — M25561 Pain in right knee: Secondary | ICD-10-CM | POA: Diagnosis not present

## 2017-12-14 DIAGNOSIS — M25561 Pain in right knee: Secondary | ICD-10-CM | POA: Diagnosis not present

## 2017-12-14 DIAGNOSIS — R2689 Other abnormalities of gait and mobility: Secondary | ICD-10-CM | POA: Diagnosis not present

## 2017-12-14 DIAGNOSIS — M6281 Muscle weakness (generalized): Secondary | ICD-10-CM | POA: Diagnosis not present

## 2017-12-16 DIAGNOSIS — E782 Mixed hyperlipidemia: Secondary | ICD-10-CM | POA: Diagnosis not present

## 2017-12-16 DIAGNOSIS — R0789 Other chest pain: Secondary | ICD-10-CM | POA: Diagnosis not present

## 2017-12-16 DIAGNOSIS — I10 Essential (primary) hypertension: Secondary | ICD-10-CM | POA: Diagnosis not present

## 2017-12-18 DIAGNOSIS — R2689 Other abnormalities of gait and mobility: Secondary | ICD-10-CM | POA: Diagnosis not present

## 2017-12-18 DIAGNOSIS — M25561 Pain in right knee: Secondary | ICD-10-CM | POA: Diagnosis not present

## 2017-12-18 DIAGNOSIS — M6281 Muscle weakness (generalized): Secondary | ICD-10-CM | POA: Diagnosis not present

## 2017-12-21 DIAGNOSIS — R2689 Other abnormalities of gait and mobility: Secondary | ICD-10-CM | POA: Diagnosis not present

## 2017-12-21 DIAGNOSIS — M6281 Muscle weakness (generalized): Secondary | ICD-10-CM | POA: Diagnosis not present

## 2017-12-21 DIAGNOSIS — M25561 Pain in right knee: Secondary | ICD-10-CM | POA: Diagnosis not present

## 2017-12-23 DIAGNOSIS — M25561 Pain in right knee: Secondary | ICD-10-CM | POA: Diagnosis not present

## 2017-12-23 DIAGNOSIS — R2689 Other abnormalities of gait and mobility: Secondary | ICD-10-CM | POA: Diagnosis not present

## 2017-12-23 DIAGNOSIS — M6281 Muscle weakness (generalized): Secondary | ICD-10-CM | POA: Diagnosis not present

## 2017-12-25 DIAGNOSIS — M25561 Pain in right knee: Secondary | ICD-10-CM | POA: Diagnosis not present

## 2017-12-25 DIAGNOSIS — R2689 Other abnormalities of gait and mobility: Secondary | ICD-10-CM | POA: Diagnosis not present

## 2017-12-25 DIAGNOSIS — M6281 Muscle weakness (generalized): Secondary | ICD-10-CM | POA: Diagnosis not present

## 2017-12-28 DIAGNOSIS — M6281 Muscle weakness (generalized): Secondary | ICD-10-CM | POA: Diagnosis not present

## 2017-12-28 DIAGNOSIS — M25561 Pain in right knee: Secondary | ICD-10-CM | POA: Diagnosis not present

## 2017-12-28 DIAGNOSIS — R2689 Other abnormalities of gait and mobility: Secondary | ICD-10-CM | POA: Diagnosis not present

## 2017-12-30 DIAGNOSIS — M6281 Muscle weakness (generalized): Secondary | ICD-10-CM | POA: Diagnosis not present

## 2017-12-30 DIAGNOSIS — M25561 Pain in right knee: Secondary | ICD-10-CM | POA: Diagnosis not present

## 2017-12-30 DIAGNOSIS — R2689 Other abnormalities of gait and mobility: Secondary | ICD-10-CM | POA: Diagnosis not present

## 2018-01-04 DIAGNOSIS — M25561 Pain in right knee: Secondary | ICD-10-CM | POA: Diagnosis not present

## 2018-01-04 DIAGNOSIS — R2689 Other abnormalities of gait and mobility: Secondary | ICD-10-CM | POA: Diagnosis not present

## 2018-01-04 DIAGNOSIS — M6281 Muscle weakness (generalized): Secondary | ICD-10-CM | POA: Diagnosis not present

## 2018-02-02 DIAGNOSIS — B0239 Other herpes zoster eye disease: Secondary | ICD-10-CM | POA: Diagnosis not present

## 2018-02-22 DIAGNOSIS — B0229 Other postherpetic nervous system involvement: Secondary | ICD-10-CM | POA: Diagnosis not present

## 2018-03-09 DIAGNOSIS — H04123 Dry eye syndrome of bilateral lacrimal glands: Secondary | ICD-10-CM | POA: Diagnosis not present

## 2018-03-29 DIAGNOSIS — B0229 Other postherpetic nervous system involvement: Secondary | ICD-10-CM | POA: Diagnosis not present

## 2018-05-26 DIAGNOSIS — E7800 Pure hypercholesterolemia, unspecified: Secondary | ICD-10-CM | POA: Diagnosis not present

## 2018-05-26 DIAGNOSIS — I10 Essential (primary) hypertension: Secondary | ICD-10-CM | POA: Diagnosis not present

## 2018-05-26 DIAGNOSIS — M810 Age-related osteoporosis without current pathological fracture: Secondary | ICD-10-CM | POA: Diagnosis not present

## 2018-06-02 DIAGNOSIS — M79671 Pain in right foot: Secondary | ICD-10-CM | POA: Diagnosis not present

## 2018-06-02 DIAGNOSIS — B0229 Other postherpetic nervous system involvement: Secondary | ICD-10-CM | POA: Diagnosis not present

## 2018-06-02 DIAGNOSIS — K136 Irritative hyperplasia of oral mucosa: Secondary | ICD-10-CM | POA: Diagnosis not present

## 2018-06-02 DIAGNOSIS — K146 Glossodynia: Secondary | ICD-10-CM | POA: Diagnosis not present

## 2018-06-02 DIAGNOSIS — I10 Essential (primary) hypertension: Secondary | ICD-10-CM | POA: Diagnosis not present

## 2018-06-02 DIAGNOSIS — M810 Age-related osteoporosis without current pathological fracture: Secondary | ICD-10-CM | POA: Diagnosis not present

## 2018-06-02 DIAGNOSIS — I341 Nonrheumatic mitral (valve) prolapse: Secondary | ICD-10-CM | POA: Diagnosis not present

## 2018-06-02 DIAGNOSIS — J301 Allergic rhinitis due to pollen: Secondary | ICD-10-CM | POA: Diagnosis not present

## 2018-06-02 DIAGNOSIS — E7800 Pure hypercholesterolemia, unspecified: Secondary | ICD-10-CM | POA: Diagnosis not present

## 2018-06-02 DIAGNOSIS — M19071 Primary osteoarthritis, right ankle and foot: Secondary | ICD-10-CM | POA: Diagnosis not present

## 2018-06-08 DIAGNOSIS — Z79899 Other long term (current) drug therapy: Secondary | ICD-10-CM | POA: Diagnosis not present

## 2018-06-08 DIAGNOSIS — G894 Chronic pain syndrome: Secondary | ICD-10-CM | POA: Diagnosis not present

## 2018-06-08 DIAGNOSIS — B0229 Other postherpetic nervous system involvement: Secondary | ICD-10-CM | POA: Diagnosis not present

## 2018-06-08 DIAGNOSIS — Z79891 Long term (current) use of opiate analgesic: Secondary | ICD-10-CM | POA: Diagnosis not present

## 2018-06-14 DIAGNOSIS — R51 Headache: Secondary | ICD-10-CM | POA: Diagnosis not present

## 2018-06-14 DIAGNOSIS — K146 Glossodynia: Secondary | ICD-10-CM | POA: Diagnosis not present

## 2018-06-14 DIAGNOSIS — B0222 Postherpetic trigeminal neuralgia: Secondary | ICD-10-CM | POA: Diagnosis not present

## 2018-06-14 DIAGNOSIS — G894 Chronic pain syndrome: Secondary | ICD-10-CM | POA: Diagnosis not present

## 2018-06-18 DIAGNOSIS — R0789 Other chest pain: Secondary | ICD-10-CM | POA: Diagnosis not present

## 2018-06-18 DIAGNOSIS — R0989 Other specified symptoms and signs involving the circulatory and respiratory systems: Secondary | ICD-10-CM | POA: Diagnosis not present

## 2018-06-18 DIAGNOSIS — I10 Essential (primary) hypertension: Secondary | ICD-10-CM | POA: Diagnosis not present

## 2018-06-18 DIAGNOSIS — E782 Mixed hyperlipidemia: Secondary | ICD-10-CM | POA: Diagnosis not present

## 2018-06-21 DIAGNOSIS — K146 Glossodynia: Secondary | ICD-10-CM | POA: Diagnosis not present

## 2018-06-21 DIAGNOSIS — B0222 Postherpetic trigeminal neuralgia: Secondary | ICD-10-CM | POA: Diagnosis not present

## 2018-06-21 DIAGNOSIS — R51 Headache: Secondary | ICD-10-CM | POA: Diagnosis not present

## 2018-06-25 DIAGNOSIS — R0989 Other specified symptoms and signs involving the circulatory and respiratory systems: Secondary | ICD-10-CM | POA: Diagnosis not present

## 2018-06-28 DIAGNOSIS — B0222 Postherpetic trigeminal neuralgia: Secondary | ICD-10-CM | POA: Diagnosis not present

## 2018-07-05 DIAGNOSIS — B0229 Other postherpetic nervous system involvement: Secondary | ICD-10-CM | POA: Diagnosis not present

## 2018-07-05 DIAGNOSIS — G894 Chronic pain syndrome: Secondary | ICD-10-CM | POA: Diagnosis not present

## 2018-07-05 DIAGNOSIS — R51 Headache: Secondary | ICD-10-CM | POA: Diagnosis not present

## 2018-07-12 DIAGNOSIS — B0222 Postherpetic trigeminal neuralgia: Secondary | ICD-10-CM | POA: Diagnosis not present

## 2018-07-12 DIAGNOSIS — K146 Glossodynia: Secondary | ICD-10-CM | POA: Diagnosis not present

## 2018-07-12 DIAGNOSIS — R51 Headache: Secondary | ICD-10-CM | POA: Diagnosis not present

## 2018-07-12 DIAGNOSIS — G894 Chronic pain syndrome: Secondary | ICD-10-CM | POA: Diagnosis not present

## 2018-07-19 DIAGNOSIS — B0222 Postherpetic trigeminal neuralgia: Secondary | ICD-10-CM | POA: Diagnosis not present

## 2018-07-26 DIAGNOSIS — R51 Headache: Secondary | ICD-10-CM | POA: Diagnosis not present

## 2018-07-26 DIAGNOSIS — B0222 Postherpetic trigeminal neuralgia: Secondary | ICD-10-CM | POA: Diagnosis not present

## 2018-07-26 DIAGNOSIS — G894 Chronic pain syndrome: Secondary | ICD-10-CM | POA: Diagnosis not present

## 2018-08-02 DIAGNOSIS — N390 Urinary tract infection, site not specified: Secondary | ICD-10-CM | POA: Diagnosis not present

## 2018-08-09 DIAGNOSIS — N390 Urinary tract infection, site not specified: Secondary | ICD-10-CM | POA: Diagnosis not present

## 2018-08-09 DIAGNOSIS — R300 Dysuria: Secondary | ICD-10-CM | POA: Diagnosis not present

## 2018-08-18 IMAGING — MR MR MRA ABDOMEN W/ OR W/O CM
11 of 17 series · 35 of 48 positions shown · IV contrast (14ml Multihance)
Comparison: 07/05/2012 CT abdomen

CLINICAL DATA: Secondary hypertension, difficult to manage
medically

EXAM:
MRA ABDOMEN AND PELVIS WITH CONTRAST
TECHNIQUE: Multiplanar, multiecho pulse sequences of the abdomen and pelvis
were obtained with intravenous contrast. Angiographic images of
abdomen and pelvis were obtained using MRA technique with
intravenous contrast.
CONTRAST:  14mL MULTIHANCE GADOBENATE DIMEGLUMINE 529 MG/ML IV SOLN

[Series 4: bSSFP · coronal · 5.0mm · 1.41mm/px · 1 of 20 slices shown (1 of 2)]
[im 1/20]
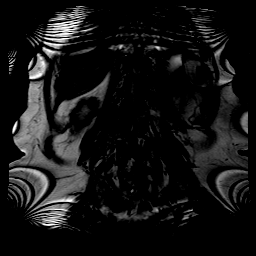

[Series 5: bSSFP · axial · 6.0mm · 1.41mm/px · 1 of 23 slices shown (2 of 2)]
[im 1/23]
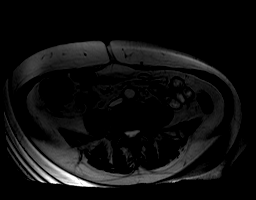

[Series 6: T2 fat-sat · coronal · 5.0mm · 1.41mm/px · 1 of 20 slices shown]
[im 1/20]
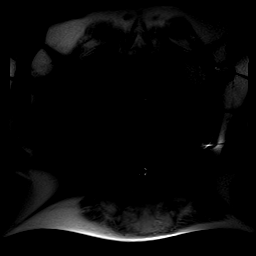

[Series 7: T2 · axial · 6.0mm · 0.70mm/px · 1 of 23 slices shown]
[im 1/23]
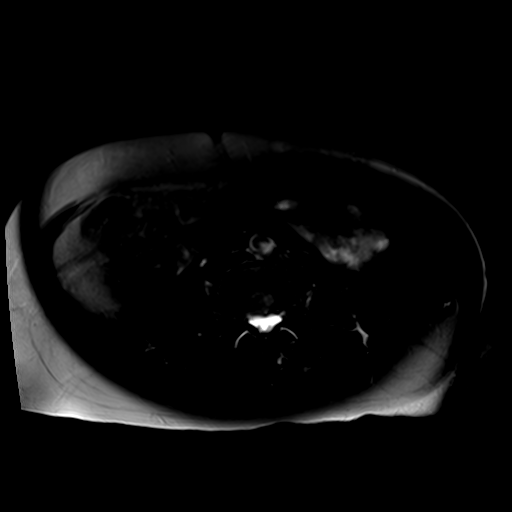

[Series 8: T1 · axial · 6.0mm · 1.41mm/px · z∈[-86,+77]mm · 2 of 23 slices shown (1 of 2)]
[im 1/23]
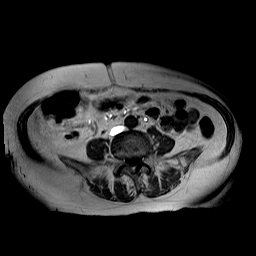
[im 23/23]
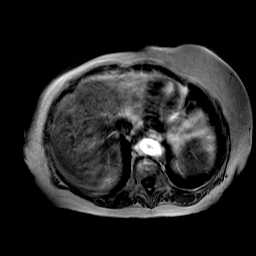

[Series 9: T1 · coronal · 5.5mm · 1.41mm/px · 2 of 19 slices shown (2 of 2)]
[im 1/19]
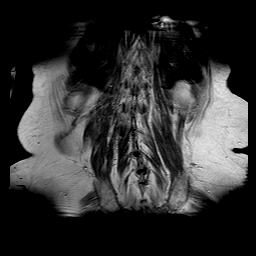
[im 19/19]
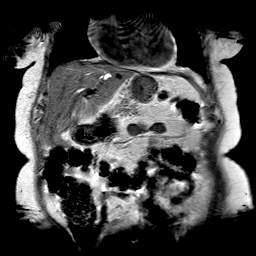

[Series 10: T1 dynamic · axial · non-contrast · 3.5mm · 0.74mm/px · z∈[-94,+69]mm · 4 of 48 slices shown]
[im 1/48]
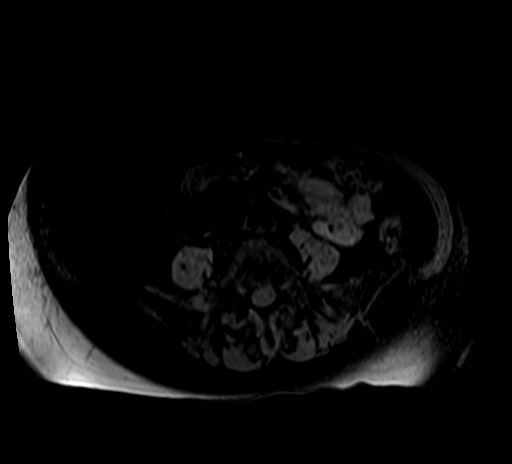
[im 16/48]
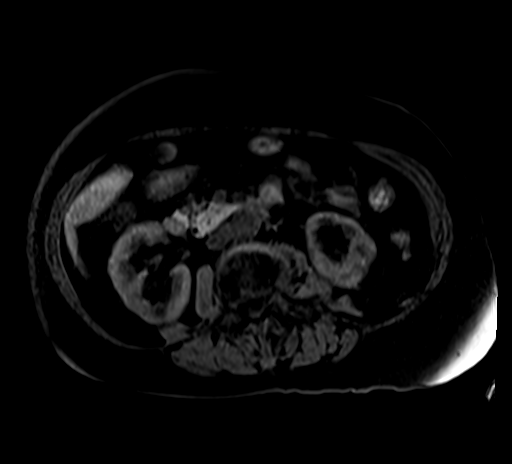
[im 32/48]
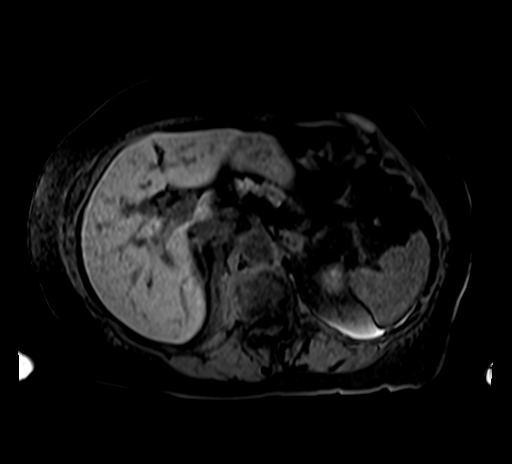
[im 48/48]
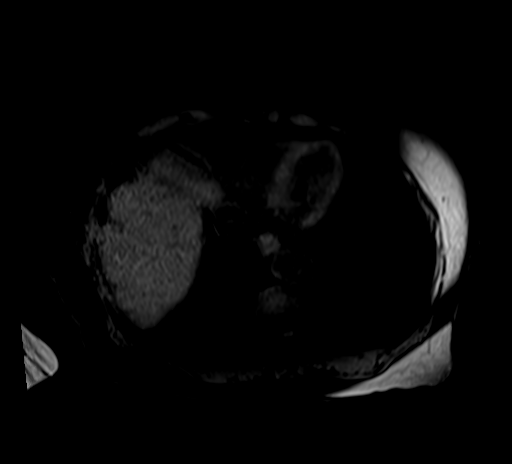

[Series 12: pre 3d_tt=1.0s · coronal · non-contrast · 1.2mm · 0.94mm/px · 7 of 80 slices shown]
[im 1/80]
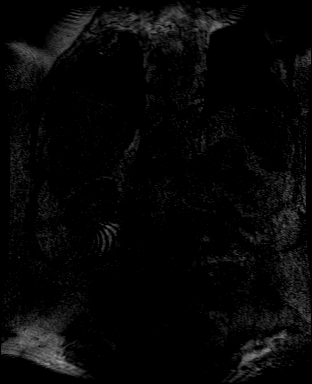
[im 14/80]
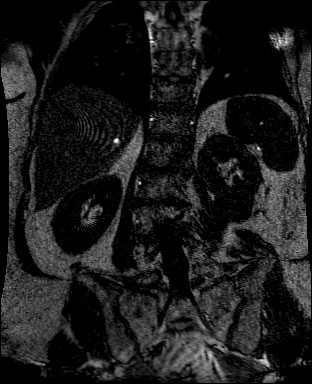
[im 27/80]
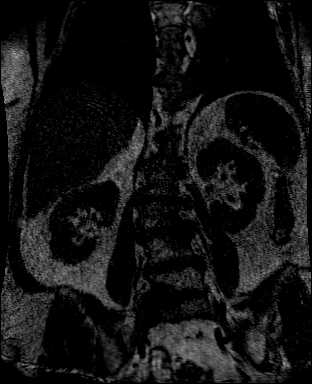
[im 40/80]
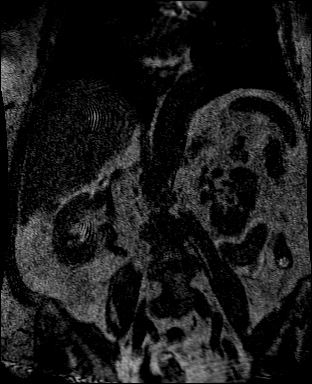
[im 53/80]
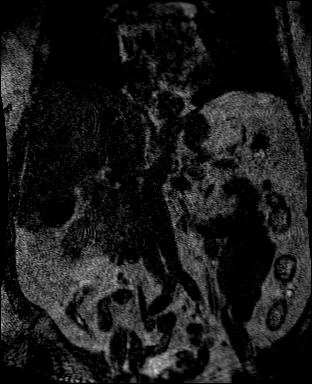
[im 66/80]
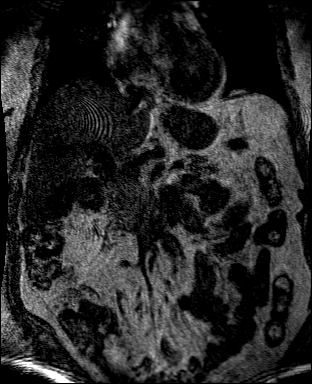
[im 80/80]
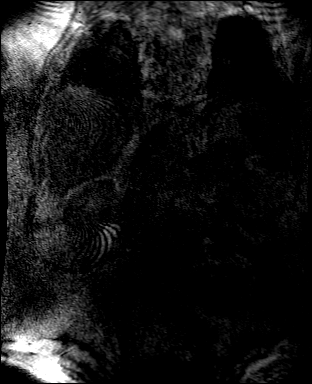

[Series 14: mra_tt=1.0s · coronal · 1.2mm · 0.94mm/px · 7 of 80 slices shown]
[im 1/80]
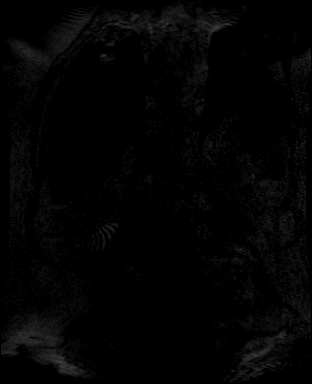
[im 14/80]
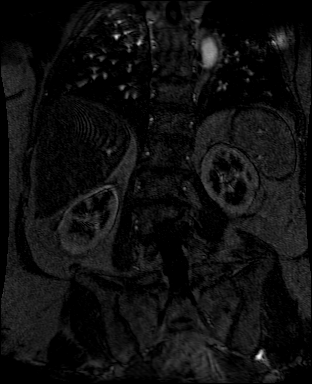
[im 27/80]
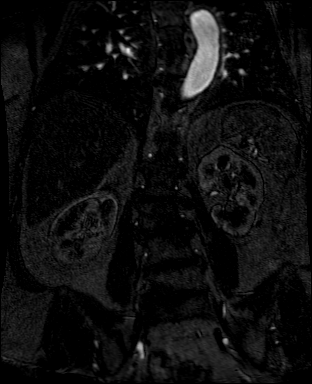
[im 40/80]
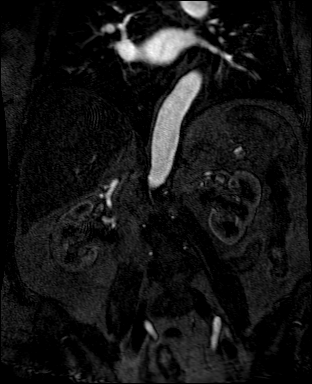
[im 53/80]
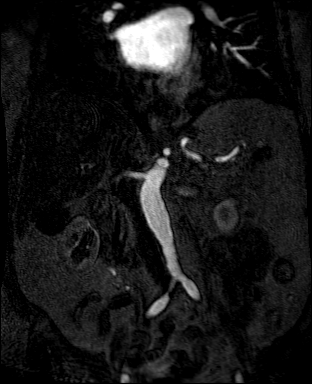
[im 66/80]
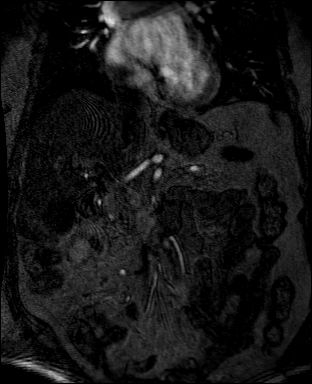
[im 80/80]
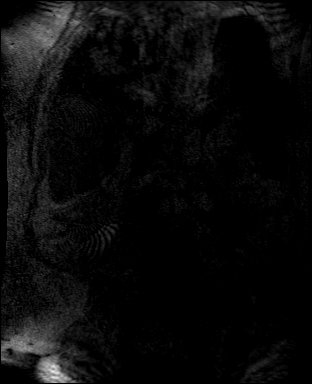

[Series 15: mra_tt=1.0s_sub · coronal · 1.2mm · 0.94mm/px · 7 of 80 slices shown]
[im 1/80]
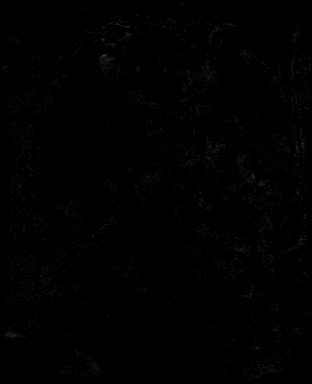
[im 14/80]
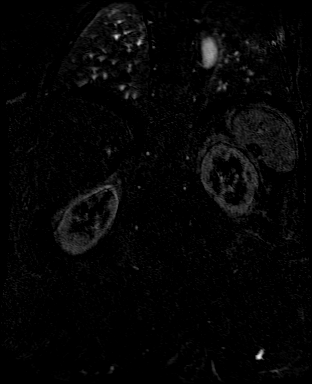
[im 27/80]
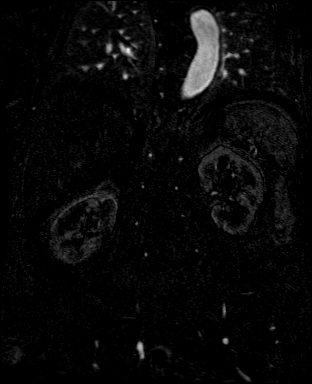
[im 40/80]
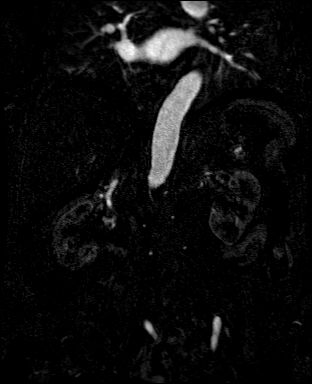
[im 53/80]
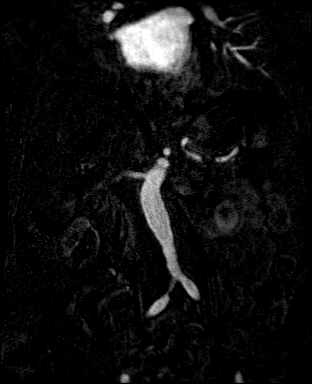
[im 66/80]
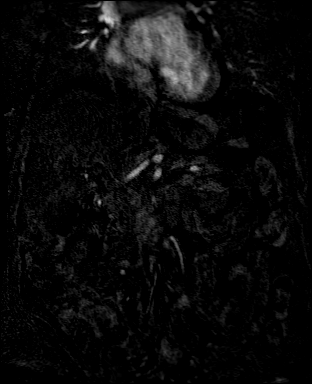
[im 80/80]
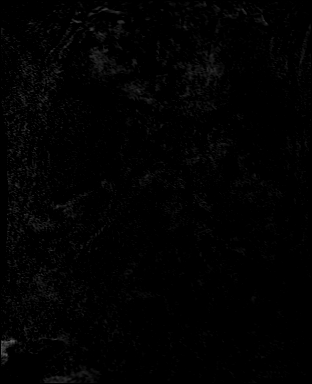

[Series 18: T1 dynamic post-contrast · axial · 3.5mm · 0.74mm/px · z∈[-94,-42]mm · 2 of 48 slices shown]
[im 1/48]
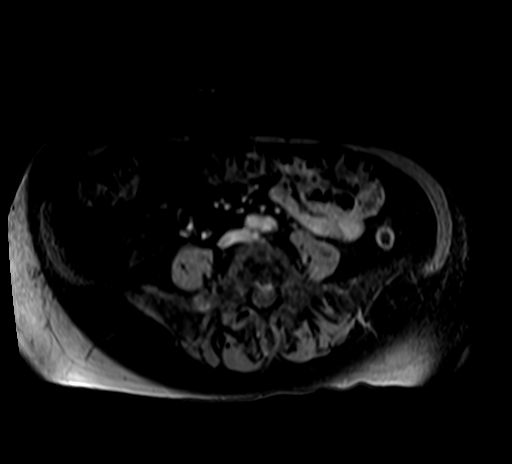
[im 16/48]
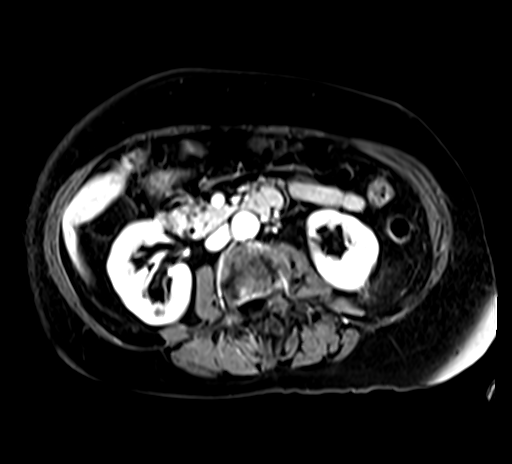

[35 of 48 positions shown; findings below may reference images not displayed]

FINDINGS: ARTERIAL

Aorta: Mild atherosclerotic change and tortuosity. Negative for
aneurysm or dissection. No retroperitoneal abnormality or hematoma.
Negative for occlusive disease.

Celiac axis:          Widely patent origin

Superior mesenteric:  Widely patent origin

Left renal: Widely patent. Minor atherosclerotic change. Negative
for stenosis

Right renal: Minor atherosclerotic change. Widely patent. Negative
for stenosis.

Inferior mesenteric: Remains patent off the distal aorta anteriorly.

Left iliac: Mild atherosclerotic change. Left common, internal and
external iliac arteries remain patent. No occlusive disease.

Right iliac: Similar mild atherosclerotic change. Right common,
internal and external iliac arteries remain patent. No occlusive
disease.

VENOUS

No Diago process.

NONVASCULAR

Lower chest:  Small hiatal hernia.  No pleural pericardial effusion.

Hepatobiliary: Small scattered hepatic cysts measuring 10 mm or
less. No biliary obstruction or dilatation. Cholelithiasis noted.

Pancreas: No mass, inflammatory changes, or other significant
abnormality.

Spleen: Within normal limits in size and appearance.

Adrenals/Urinary Tract: No hydronephrosis. Scattered small cortical
cysts. No acute renal finding.

Stomach/Bowel: No evidence of obstruction, inflammatory process, or
abnormal fluid collections.

Lymphatic: No pathologically enlarged lymph nodes.

Reproductive: Entire pelvis not imaged.

Other: None.

Musculoskeletal: Degenerative changes of the spine with associated
scoliosis.
IMPRESSION: Widely patent renal arteries.  Negative for renal artery stenosis.

Abdominal atherosclerosis without occlusive disease.

Incidental small hepatic and renal cysts

Small hiatal hernia

## 2018-08-23 DIAGNOSIS — B0229 Other postherpetic nervous system involvement: Secondary | ICD-10-CM | POA: Diagnosis not present

## 2018-08-23 DIAGNOSIS — G894 Chronic pain syndrome: Secondary | ICD-10-CM | POA: Diagnosis not present

## 2018-08-23 DIAGNOSIS — R51 Headache: Secondary | ICD-10-CM | POA: Diagnosis not present

## 2018-08-23 DIAGNOSIS — Z79899 Other long term (current) drug therapy: Secondary | ICD-10-CM | POA: Diagnosis not present

## 2018-08-23 DIAGNOSIS — Z79891 Long term (current) use of opiate analgesic: Secondary | ICD-10-CM | POA: Diagnosis not present

## 2018-09-02 DIAGNOSIS — M21612 Bunion of left foot: Secondary | ICD-10-CM | POA: Diagnosis not present

## 2018-09-02 DIAGNOSIS — M2042 Other hammer toe(s) (acquired), left foot: Secondary | ICD-10-CM | POA: Diagnosis not present

## 2018-09-02 DIAGNOSIS — M79672 Pain in left foot: Secondary | ICD-10-CM | POA: Diagnosis not present

## 2018-09-13 DIAGNOSIS — M7051 Other bursitis of knee, right knee: Secondary | ICD-10-CM | POA: Diagnosis not present

## 2018-09-13 DIAGNOSIS — Z96651 Presence of right artificial knee joint: Secondary | ICD-10-CM | POA: Diagnosis not present

## 2018-09-20 DIAGNOSIS — B0229 Other postherpetic nervous system involvement: Secondary | ICD-10-CM | POA: Diagnosis not present

## 2018-09-20 DIAGNOSIS — R51 Headache: Secondary | ICD-10-CM | POA: Diagnosis not present

## 2018-09-20 DIAGNOSIS — Z79899 Other long term (current) drug therapy: Secondary | ICD-10-CM | POA: Diagnosis not present

## 2018-09-20 DIAGNOSIS — G894 Chronic pain syndrome: Secondary | ICD-10-CM | POA: Diagnosis not present

## 2018-09-20 DIAGNOSIS — Z79891 Long term (current) use of opiate analgesic: Secondary | ICD-10-CM | POA: Diagnosis not present

## 2018-09-23 DIAGNOSIS — M205X2 Other deformities of toe(s) (acquired), left foot: Secondary | ICD-10-CM | POA: Diagnosis not present

## 2018-09-23 DIAGNOSIS — M21612 Bunion of left foot: Secondary | ICD-10-CM | POA: Diagnosis not present

## 2018-09-23 DIAGNOSIS — M2042 Other hammer toe(s) (acquired), left foot: Secondary | ICD-10-CM | POA: Diagnosis not present

## 2018-09-28 DIAGNOSIS — G8929 Other chronic pain: Secondary | ICD-10-CM | POA: Diagnosis not present

## 2018-09-28 DIAGNOSIS — Z01818 Encounter for other preprocedural examination: Secondary | ICD-10-CM | POA: Diagnosis not present

## 2018-10-04 DIAGNOSIS — M21612 Bunion of left foot: Secondary | ICD-10-CM | POA: Diagnosis not present

## 2018-10-04 DIAGNOSIS — M205X2 Other deformities of toe(s) (acquired), left foot: Secondary | ICD-10-CM | POA: Diagnosis not present

## 2018-10-04 DIAGNOSIS — M24575 Contracture, left foot: Secondary | ICD-10-CM | POA: Diagnosis not present

## 2018-10-04 DIAGNOSIS — M2042 Other hammer toe(s) (acquired), left foot: Secondary | ICD-10-CM | POA: Diagnosis not present

## 2018-10-09 HISTORY — PX: FOOT SURGERY: SHX648

## 2018-10-13 DIAGNOSIS — M21612 Bunion of left foot: Secondary | ICD-10-CM | POA: Diagnosis not present

## 2018-10-25 DIAGNOSIS — M21612 Bunion of left foot: Secondary | ICD-10-CM | POA: Diagnosis not present

## 2018-10-26 DIAGNOSIS — N3020 Other chronic cystitis without hematuria: Secondary | ICD-10-CM | POA: Diagnosis not present

## 2018-10-26 DIAGNOSIS — N281 Cyst of kidney, acquired: Secondary | ICD-10-CM | POA: Diagnosis not present

## 2018-10-26 DIAGNOSIS — N952 Postmenopausal atrophic vaginitis: Secondary | ICD-10-CM | POA: Diagnosis not present

## 2018-11-03 DIAGNOSIS — H04123 Dry eye syndrome of bilateral lacrimal glands: Secondary | ICD-10-CM | POA: Diagnosis not present

## 2018-11-05 ENCOUNTER — Other Ambulatory Visit: Payer: Self-pay | Admitting: Cardiology

## 2018-11-05 ENCOUNTER — Other Ambulatory Visit: Payer: Self-pay

## 2018-11-16 DIAGNOSIS — M21612 Bunion of left foot: Secondary | ICD-10-CM | POA: Diagnosis not present

## 2018-11-19 DIAGNOSIS — B0229 Other postherpetic nervous system involvement: Secondary | ICD-10-CM | POA: Diagnosis not present

## 2018-11-19 DIAGNOSIS — Z79899 Other long term (current) drug therapy: Secondary | ICD-10-CM | POA: Diagnosis not present

## 2018-11-19 DIAGNOSIS — R51 Headache: Secondary | ICD-10-CM | POA: Diagnosis not present

## 2018-11-19 DIAGNOSIS — G894 Chronic pain syndrome: Secondary | ICD-10-CM | POA: Diagnosis not present

## 2018-11-19 DIAGNOSIS — Z79891 Long term (current) use of opiate analgesic: Secondary | ICD-10-CM | POA: Diagnosis not present

## 2018-12-07 ENCOUNTER — Telehealth: Payer: Self-pay

## 2018-12-07 NOTE — Telephone Encounter (Signed)
Pt called stating that sh started having pain in the middle of her chest last night that radiates to under left breast. Hurts when she presses on center of chest. Tried tramadol with some relief about 30 min ago. Please advise.//ah

## 2018-12-07 NOTE — Telephone Encounter (Signed)
Pt aware of AK instructions.//ah

## 2018-12-22 ENCOUNTER — Ambulatory Visit (INDEPENDENT_AMBULATORY_CARE_PROVIDER_SITE_OTHER): Payer: Medicare Other | Admitting: Cardiology

## 2018-12-22 ENCOUNTER — Encounter: Payer: Self-pay | Admitting: Cardiology

## 2018-12-22 ENCOUNTER — Other Ambulatory Visit: Payer: Self-pay

## 2018-12-22 VITALS — BP 130/70 | Ht 63.0 in | Wt 135.0 lb

## 2018-12-22 DIAGNOSIS — I10 Essential (primary) hypertension: Secondary | ICD-10-CM | POA: Diagnosis not present

## 2018-12-22 NOTE — Progress Notes (Signed)
Telephone visit note  Subjective:   Faith Ritter, female    DOB: 04-26-1936, 83 y.o.   MRN: [id number]   I connected with the patient on 12/22/18 by a telephone call and verified that I am speaking with the correct person using two identifiers.     I offered the patient a video enabled application for a virtual visit. Unfortunately, this could not be accomplished due to technical difficulties/lack of video enabled phone/computer. I discussed the limitations of evaluation and management by telemedicine and the availability of in person appointments. The patient expressed understanding and agreed to proceed.   This visit type was conducted due to national recommendations for restrictions regarding the COVID-19 Pandemic (e.g. social distancing).  This format is felt to be most appropriate for this patient at this time.  All issues noted in this document were discussed and addressed.  No physical exam was performed (except for noted visual exam findings with Tele health visits).  The patient has consented to conduct a Tele health visit and understands insurance will be billed.      Chief complaint:  Hypertension   HPI  84 year old female with hypertension, hyperlipidemia, remote history of pulmonary embolism, migraine and depression.  She is currently staying with her son in [place]. She has not had any recent chest pain, shortness of breath, leg edema. BP is well controlled.    Past Medical History:  Diagnosis Date  . Arthritis   . Heart murmur   . Hypercholesteremia   . Hypertension   . MVP (mitral valve prolapse)   . Pulmonary embolism during pregnancy with complication, antenatal    50 years ago      Past Surgical History:  Procedure Laterality Date  . BREAST SURGERY     Benign L breast cyst  . CATARACT EXTRACTION Bilateral   . EYE SURGERY    . FOOT SURGERY Right   . KNEE ARTHROPLASTY Right 12/04/2016   Procedure: RIGHT TOTAL KNEE ARTHROPLASTY WITH COMPUTER  NAVIGATION;  Surgeon: Rod Can, MD;  Location: [facility];  Service: Orthopedics;  Laterality: Right;  Adductor Block     Social History   Socioeconomic History  . Marital status: Divorced    Spouse name: Not on file  . Number of children: 5  . Years of education: college  . Highest education level: Not on file  Occupational History    Comment: retired  Scientific laboratory technician  . Financial resource strain: Not on file  . Food insecurity:    Worry: Not on file    Inability: Not on file  . Transportation needs:    Medical: Not on file    Non-medical: Not on file  Tobacco Use  . Smoking status: Former Smoker    Packs/day: 0.50    Years: 30.00    Pack years: 15.00    Types: Cigarettes  . Smokeless tobacco: Never Used  . Tobacco comment: Quit 30 years ago  Substance and Sexual Activity  . Alcohol use: Yes    Comment: occasionally  . Drug use: No  . Sexual activity: Not on file  Lifestyle  . Physical activity:    Days per week: Not on file    Minutes per session: Not on file  . Stress: Not on file  Relationships  . Social connections:    Talks on phone: Not on file    Gets together: Not on file    Attends religious service: Not on file    Active member of club or  organization: Not on file    Attends meetings of clubs or organizations: Not on file    Relationship status: Not on file  . Intimate partner violence:    Fear of current or ex partner: Not on file    Emotionally abused: Not on file    Physically abused: Not on file    Forced sexual activity: Not on file  Other Topics Concern  . Not on file  Social History Narrative   Patient lives [facility]   Retired.   Education- some college   Left handed.   Caffeine- Two cups daily.     Family History  Problem Relation Age of Onset  . Stroke Mother   . Heart attack Father      Current Outpatient Medications on File Prior to Visit  Medication Sig Dispense Refill  . amLODipine (NORVASC)  10 MG tablet TAKE 1 TABLET BY MOUTH EVERY DAY 90 tablet 1  . aspirin EC 81 MG tablet Take 81 mg by mouth daily.    . Calcium-Phosphorus-Vitamin D (CITRACAL +D3 PO) Take 1 tablet by mouth 2 (two) times daily.    . celecoxib (CELEBREX) 100 MG capsule Take 100 mg by mouth 2 (two) times daily.    . fenofibrate (TRICOR) 48 MG tablet Take 48 mg by mouth every evening. Reported on 09/22/2015    . isosorbide-hydrALAZINE (BIDIL) 20-37.5 MG per tablet Take 1 tablet by mouth 4 (four) times daily.     Marland Kitchen l-methylfolate-B6-B12 (METANX) 3-35-2 MG TABS Take 1 tablet by mouth 2 (two) times daily.     Marland Kitchen losartan-hydrochlorothiazide (HYZAAR) 100-25 MG per tablet Take 1 tablet by mouth daily.     . metoprolol succinate (TOPROL-XL) 50 MG 24 hr tablet Take 50 mg by mouth daily. Take with or immediately following a meal.    . montelukast (SINGULAIR) 10 MG tablet Take 10 mg by mouth at bedtime as needed.     . Multiple Vitamin (MULTIVITAMIN) capsule Take 1 capsule by mouth daily.     No current facility-administered medications on file prior to visit.     Cardiovascular studies:  EKG 10/14/2017: Sinus rhythm 66 bpm with normal axis.  Normal conduction.  One PVC.  No ischemic changes.  Carotid artery duplex 06/25/2018: No hemodynamically significant arterial disease in the internal carotid artery bilaterally. No significant plaque burden. Antegrade right vertebral artery flow. Antegrade left vertebral artery flow. No significant change 06/04/10.  Echocardiogram 10/29/2016: Left ventricle cavity is normal in size. Normal global wall motion. Normal diastolic filling pattern, normal LAP. Calculated EF 66%. Right ventricle cavity is normal in size. Normal right ventricular function. A moderateor band is noted at the RV apex (normal variant). Mild (Grade I) mitral regurgitation. Trace tricuspid regurgitation. No evidence of pulmonary hypertension, pressure may be underestimated. IVC is normal with poor inspiration  collapse consistent with elevated right atrial pressure. Compared to 06/04/2010, suggestion of increased right heart pressure is new.  Lexiscan myoview stress test 10/31/2016: 1. Resting EKG demonstrates normal sinus rhythm, normal axis, poor R-wave progression, borderline low voltage complexes.  Stress EKG is non-diagnostic for ischemia and sits a pharmacologic stress test.  There are occasional PACs and PVCs, brief episodes of atrial tachycardia.  Symptoms included lightheadedness. 2. Myocardial perfusion imaging is normal. Overall left ventricular systolic function was normal without regional wall motion abnormalities. The left ventricular ejection fraction was 68%.  Recent labs: Labs 10/14/2017: H/H 12.8/37.8.  MCV 91.  Platelets 251 Glucose 103.  BUN/creatinine 22/0.89.  EGFR >60.  Sodium 134, potassium 3.6. Troponin <0.03 X 2  D-dimer 1.16, elevated   Review of Systems  Constitution: Negative for decreased appetite, malaise/fatigue, weight gain and weight loss.  HENT: Negative for congestion.   Eyes: Negative for visual disturbance.  Cardiovascular: Negative for chest pain, dyspnea on exertion, leg swelling, palpitations and syncope.  Respiratory: Negative for shortness of breath.   Endocrine: Negative for cold intolerance.  Hematologic/Lymphatic: Does not bruise/bleed easily.  Skin: Negative for itching and rash.  Musculoskeletal: Negative for myalgias.  Gastrointestinal: Negative for abdominal pain, nausea and vomiting.  Genitourinary: Negative for dysuria.  Neurological: Negative for dizziness and weakness.  Psychiatric/Behavioral: The patient is not nervous/anxious.   All other systems reviewed and are negative.        Vitals:   11/15/18 1012  BP: 130/70   (Measured by the patient using a home BP monitor)   Observation/findings during video visit   Objective:     Physical exam: Not performed., as this is a telephone visit       Assessment &  Recommendations:   83 year old female with resistant hypertension, hyperlipidemia, remote history of pulmonary embolism, migraine and depression.  Hypertension: Well controlled on current therapy. No change made today.  Primary prevention: No indication for Aspirin. Stopped today  I will see her back in 6 months.     Nigel Mormon, MD [facility] Pager: [phone] Office: [phone] If no answer Cell [phone]

## 2019-01-19 DIAGNOSIS — B0229 Other postherpetic nervous system involvement: Secondary | ICD-10-CM | POA: Diagnosis not present

## 2019-01-19 DIAGNOSIS — Z79899 Other long term (current) drug therapy: Secondary | ICD-10-CM | POA: Diagnosis not present

## 2019-01-19 DIAGNOSIS — R51 Headache: Secondary | ICD-10-CM | POA: Diagnosis not present

## 2019-01-19 DIAGNOSIS — G894 Chronic pain syndrome: Secondary | ICD-10-CM | POA: Diagnosis not present

## 2019-01-20 DIAGNOSIS — R1310 Dysphagia, unspecified: Secondary | ICD-10-CM | POA: Diagnosis not present

## 2019-01-20 DIAGNOSIS — Z7189 Other specified counseling: Secondary | ICD-10-CM | POA: Diagnosis not present

## 2019-02-01 ENCOUNTER — Other Ambulatory Visit: Payer: Self-pay

## 2019-02-01 MED ORDER — AMLODIPINE BESYLATE 5 MG PO TABS: 5.0000 mg | ORAL_TABLET | Freq: Every day | ORAL | 3 refills | Status: DC

## 2019-02-09 DIAGNOSIS — R1310 Dysphagia, unspecified: Secondary | ICD-10-CM | POA: Diagnosis not present

## 2019-02-09 DIAGNOSIS — K219 Gastro-esophageal reflux disease without esophagitis: Secondary | ICD-10-CM | POA: Diagnosis not present

## 2019-02-09 DIAGNOSIS — R634 Abnormal weight loss: Secondary | ICD-10-CM | POA: Diagnosis not present

## 2019-02-14 DIAGNOSIS — M25562 Pain in left knee: Secondary | ICD-10-CM | POA: Diagnosis not present

## 2019-02-14 DIAGNOSIS — M1712 Unilateral primary osteoarthritis, left knee: Secondary | ICD-10-CM | POA: Diagnosis not present

## 2019-02-17 DIAGNOSIS — M21612 Bunion of left foot: Secondary | ICD-10-CM | POA: Diagnosis not present

## 2019-02-21 DIAGNOSIS — Z85820 Personal history of malignant melanoma of skin: Secondary | ICD-10-CM | POA: Diagnosis not present

## 2019-02-21 DIAGNOSIS — L819 Disorder of pigmentation, unspecified: Secondary | ICD-10-CM | POA: Diagnosis not present

## 2019-02-21 DIAGNOSIS — D1801 Hemangioma of skin and subcutaneous tissue: Secondary | ICD-10-CM | POA: Diagnosis not present

## 2019-02-21 DIAGNOSIS — L821 Other seborrheic keratosis: Secondary | ICD-10-CM | POA: Diagnosis not present

## 2019-02-21 DIAGNOSIS — D229 Melanocytic nevi, unspecified: Secondary | ICD-10-CM | POA: Diagnosis not present

## 2019-02-21 DIAGNOSIS — L814 Other melanin hyperpigmentation: Secondary | ICD-10-CM | POA: Diagnosis not present

## 2019-02-22 DIAGNOSIS — M25562 Pain in left knee: Secondary | ICD-10-CM | POA: Diagnosis not present

## 2019-03-07 DIAGNOSIS — M25562 Pain in left knee: Secondary | ICD-10-CM | POA: Diagnosis not present

## 2019-03-09 DIAGNOSIS — M25562 Pain in left knee: Secondary | ICD-10-CM | POA: Diagnosis not present

## 2019-03-14 DIAGNOSIS — M25562 Pain in left knee: Secondary | ICD-10-CM | POA: Diagnosis not present

## 2019-03-16 DIAGNOSIS — M25562 Pain in left knee: Secondary | ICD-10-CM | POA: Diagnosis not present

## 2019-03-17 DIAGNOSIS — M21612 Bunion of left foot: Secondary | ICD-10-CM | POA: Diagnosis not present

## 2019-03-21 DIAGNOSIS — G894 Chronic pain syndrome: Secondary | ICD-10-CM | POA: Diagnosis not present

## 2019-03-21 DIAGNOSIS — B0222 Postherpetic trigeminal neuralgia: Secondary | ICD-10-CM | POA: Diagnosis not present

## 2019-03-21 DIAGNOSIS — R51 Headache: Secondary | ICD-10-CM | POA: Diagnosis not present

## 2019-03-22 DIAGNOSIS — M25562 Pain in left knee: Secondary | ICD-10-CM | POA: Diagnosis not present

## 2019-03-29 DIAGNOSIS — R1310 Dysphagia, unspecified: Secondary | ICD-10-CM | POA: Diagnosis not present

## 2019-03-29 DIAGNOSIS — K222 Esophageal obstruction: Secondary | ICD-10-CM | POA: Diagnosis not present

## 2019-03-29 DIAGNOSIS — K449 Diaphragmatic hernia without obstruction or gangrene: Secondary | ICD-10-CM | POA: Diagnosis not present

## 2019-05-04 DIAGNOSIS — K449 Diaphragmatic hernia without obstruction or gangrene: Secondary | ICD-10-CM | POA: Diagnosis not present

## 2019-05-04 DIAGNOSIS — K222 Esophageal obstruction: Secondary | ICD-10-CM | POA: Diagnosis not present

## 2019-05-04 DIAGNOSIS — R1310 Dysphagia, unspecified: Secondary | ICD-10-CM | POA: Diagnosis not present

## 2019-05-17 DIAGNOSIS — B0229 Other postherpetic nervous system involvement: Secondary | ICD-10-CM | POA: Diagnosis not present

## 2019-05-17 DIAGNOSIS — Z79899 Other long term (current) drug therapy: Secondary | ICD-10-CM | POA: Diagnosis not present

## 2019-05-17 DIAGNOSIS — R51 Headache: Secondary | ICD-10-CM | POA: Diagnosis not present

## 2019-05-17 DIAGNOSIS — Z79891 Long term (current) use of opiate analgesic: Secondary | ICD-10-CM | POA: Diagnosis not present

## 2019-05-17 DIAGNOSIS — G894 Chronic pain syndrome: Secondary | ICD-10-CM | POA: Diagnosis not present

## 2019-05-30 DIAGNOSIS — I10 Essential (primary) hypertension: Secondary | ICD-10-CM | POA: Diagnosis not present

## 2019-05-30 DIAGNOSIS — R739 Hyperglycemia, unspecified: Secondary | ICD-10-CM | POA: Diagnosis not present

## 2019-05-30 DIAGNOSIS — E7800 Pure hypercholesterolemia, unspecified: Secondary | ICD-10-CM | POA: Diagnosis not present

## 2019-06-06 DIAGNOSIS — E7800 Pure hypercholesterolemia, unspecified: Secondary | ICD-10-CM | POA: Diagnosis not present

## 2019-06-06 DIAGNOSIS — B0229 Other postherpetic nervous system involvement: Secondary | ICD-10-CM | POA: Diagnosis not present

## 2019-06-06 DIAGNOSIS — I10 Essential (primary) hypertension: Secondary | ICD-10-CM | POA: Diagnosis not present

## 2019-06-06 DIAGNOSIS — Z23 Encounter for immunization: Secondary | ICD-10-CM | POA: Diagnosis not present

## 2019-06-28 ENCOUNTER — Encounter: Payer: Self-pay | Admitting: Cardiology

## 2019-06-29 ENCOUNTER — Ambulatory Visit: Payer: Medicare Other | Admitting: Cardiology

## 2019-07-11 ENCOUNTER — Other Ambulatory Visit: Payer: Self-pay

## 2019-07-11 ENCOUNTER — Ambulatory Visit (INDEPENDENT_AMBULATORY_CARE_PROVIDER_SITE_OTHER): Payer: Medicare Other | Admitting: Cardiology

## 2019-07-11 ENCOUNTER — Encounter: Payer: Self-pay | Admitting: Cardiology

## 2019-07-11 VITALS — BP 127/67 | HR 58 | Temp 97.3°F | Ht 63.0 in | Wt 147.0 lb

## 2019-07-11 DIAGNOSIS — I10 Essential (primary) hypertension: Secondary | ICD-10-CM

## 2019-07-11 DIAGNOSIS — R0600 Dyspnea, unspecified: Secondary | ICD-10-CM | POA: Diagnosis not present

## 2019-07-11 DIAGNOSIS — R0609 Other forms of dyspnea: Secondary | ICD-10-CM | POA: Insufficient documentation

## 2019-07-11 NOTE — Progress Notes (Signed)
Subjective:   Faith Ritter, female    DOB: 02-Dec-1935, 83 y.o.   MRN: [id number]    Chief complaint:  Hypertension   HPI  83 year old female with hypertension, hyperlipidemia, remote history of pulmonary embolism, migraine and depression.  She underwent foot surgery earlier this year. Since then, her physical activity was reduced. She has now started walking again. She notices shortness of breath only while walking fast. She denies any chest pain. Blood pressure is well controlled. She recently underwent lipid panel through PCP, results not available to me.    Past Medical History:  Diagnosis Date  . Arthritis   . Heart murmur   . Hypercholesteremia   . Hypertension   . MVP (mitral valve prolapse)   . Pulmonary embolism during pregnancy with complication, antenatal    50 years ago      Past Surgical History:  Procedure Laterality Date  . BREAST SURGERY     Benign L breast cyst  . CATARACT EXTRACTION Bilateral   . EYE SURGERY    . FOOT SURGERY Right   . FOOT SURGERY Left 10/2018  . KNEE ARTHROPLASTY Right 12/04/2016   Procedure: RIGHT TOTAL KNEE ARTHROPLASTY WITH COMPUTER NAVIGATION;  Surgeon: Rod Can, MD;  Location: [facility];  Service: Orthopedics;  Laterality: Right;  Adductor Block     Social History   Socioeconomic History  . Marital status: Divorced    Spouse name: Not on file  . Number of children: 5  . Years of education: college  . Highest education level: Not on file  Occupational History    Comment: retired  Scientific laboratory technician  . Financial resource strain: Not on file  . Food insecurity    Worry: Not on file    Inability: Not on file  . Transportation needs    Medical: Not on file    Non-medical: Not on file  Tobacco Use  . Smoking status: Former Smoker    Packs/day: 0.50    Years: 30.00    Pack years: 15.00    Types: Cigarettes    Quit date: 12/22/1978    Years since quitting: 40.5  . Smokeless tobacco: Never Used  . Tobacco  comment: Quit 30 years ago  Substance and Sexual Activity  . Alcohol use: Not Currently  . Drug use: No  . Sexual activity: Not on file  Lifestyle  . Physical activity    Days per week: Not on file    Minutes per session: Not on file  . Stress: Not on file  Relationships  . Social Herbalist on phone: Not on file    Gets together: Not on file    Attends religious service: Not on file    Active member of club or organization: Not on file    Attends meetings of clubs or organizations: Not on file    Relationship status: Not on file  . Intimate partner violence    Fear of current or ex partner: Not on file    Emotionally abused: Not on file    Physically abused: Not on file    Forced sexual activity: Not on file  Other Topics Concern  . Not on file  Social History Narrative   Patient lives in [facility] [place]   Retired.   Education- some college   Left handed.   Caffeine- Two cups daily.     Family History  Problem Relation Age of Onset  . Stroke Mother   .  Heart attack Father   . Heart disease Sister   . Heart disease Brother   . Hypertension Brother   . Stroke Sister      Current Outpatient Medications on File Prior to Visit  Medication Sig Dispense Refill  . amLODipine (NORVASC) 5 MG tablet Take 1 tablet (5 mg total) by mouth daily. 90 tablet 3  . Calcium-Phosphorus-Vitamin D (CITRACAL +D3 PO) Take 1 tablet by mouth 2 (two) times daily.    . fenofibrate (TRICOR) 48 MG tablet Take 48 mg by mouth every evening. Reported on 09/22/2015    . gabapentin (NEURONTIN) 300 MG capsule Take 1 capsule by mouth. 2-3 x daily    . isosorbide-hydrALAZINE (BIDIL) 20-37.5 MG per tablet Take 1 tablet by mouth 3 (three) times daily.     Marland Kitchen losartan-hydrochlorothiazide (HYZAAR) 100-25 MG per tablet Take 1 tablet by mouth daily.     . metoprolol succinate (TOPROL-XL) 50 MG 24 hr tablet Take 50 mg by mouth daily. Take with or immediately following a meal.    .  montelukast (SINGULAIR) 10 MG tablet Take 10 mg by mouth at bedtime as needed.     . Multiple Vitamin (MULTIVITAMIN) capsule Take 1 capsule by mouth daily.    . traMADol (ULTRAM) 50 MG tablet Take 1 tablet by mouth daily.     No current facility-administered medications on file prior to visit.     Cardiovascular studies:  EKG 07/11/2019: Sinus rhythm 54 bpm. First degree A-V block. PR interval 240 msec.   Carotid artery duplex 06/25/2018: No hemodynamically significant arterial disease in the internal carotid artery bilaterally. No significant plaque burden. Antegrade right vertebral artery flow. Antegrade left vertebral artery flow. No significant change 06/04/10.  Echocardiogram 10/29/2016: Left ventricle cavity is normal in size. Normal global wall motion. Normal diastolic filling pattern, normal LAP. Calculated EF 66%. Right ventricle cavity is normal in size. Normal right ventricular function. A moderateor band is noted at the RV apex (normal variant). Mild (Grade I) mitral regurgitation. Trace tricuspid regurgitation. No evidence of pulmonary hypertension, pressure may be underestimated. IVC is normal with poor inspiration collapse consistent with elevated right atrial pressure. Compared to 06/04/2010, suggestion of increased right heart pressure is new.  Lexiscan myoview stress test 10/31/2016: 1. Resting EKG demonstrates normal sinus rhythm, normal axis, poor R-wave progression, borderline low voltage complexes.  Stress EKG is non-diagnostic for ischemia and sits a pharmacologic stress test.  There are occasional PACs and PVCs, brief episodes of atrial tachycardia.  Symptoms included lightheadedness. 2. Myocardial perfusion imaging is normal. Overall left ventricular systolic function was normal without regional wall motion abnormalities. The left ventricular ejection fraction was 68%.  Recent labs: Labs 10/14/2017: H/H 12.8/37.8.  MCV 91.  Platelets 251 Glucose 103.   BUN/creatinine 22/0.89.  EGFR >60.  Sodium 134, potassium 3.6. Troponin <0.03 X 2  D-dimer 1.16, elevated   Review of Systems  Constitution: Negative for decreased appetite, malaise/fatigue, weight gain and weight loss.  HENT: Negative for congestion.   Eyes: Negative for visual disturbance.  Cardiovascular: Negative for chest pain, dyspnea on exertion, leg swelling, palpitations and syncope.  Respiratory: Negative for shortness of breath.   Endocrine: Negative for cold intolerance.  Hematologic/Lymphatic: Does not bruise/bleed easily.  Skin: Negative for itching and rash.  Musculoskeletal: Negative for myalgias.  Gastrointestinal: Negative for abdominal pain, nausea and vomiting.  Genitourinary: Negative for dysuria.  Neurological: Negative for dizziness and weakness.  Psychiatric/Behavioral: The patient is not nervous/anxious.   All other systems reviewed  and are negative.        Vitals:   07/11/19 1500  BP: 127/67  Pulse: (!) 58  Temp: (!) 97.3 F (36.3 C)  SpO2: 96%     Objective:     Physical exam: Physical Exam  Constitutional: She is oriented to person, place, and time. She appears well-developed and well-nourished. No distress.  HENT:  Head: Normocephalic and atraumatic.  Eyes: Pupils are equal, round, and reactive to light. Conjunctivae are normal.  Neck: No JVD present.  Cardiovascular: Normal rate, regular rhythm and intact distal pulses.  No murmur heard. Pulmonary/Chest: Effort normal and breath sounds normal. She has no wheezes. She has no rales.  Abdominal: Soft. Bowel sounds are normal. There is no rebound.  Musculoskeletal:        General: No edema.  Lymphadenopathy:    She has no cervical adenopathy.  Neurological: She is alert and oriented to person, place, and time. No cranial nerve deficit.  Skin: Skin is warm and dry.  Psychiatric: She has a normal mood and affect.  Nursing note and vitals reviewed.         Assessment &  Recommendations:   83 year old female with resistant hypertension, hyperlipidemia, remote history of pulmonary embolism, migraine and depression.  Hypertension: Well controlled on current therapy. No change made today.  Exertional dyspnea: Mild. Could be due to deconditioning. I will see her back in 3 months. If it persists, could consider stress test.   I will see her back in 6 months.     Nigel Mormon, MD [facility] Pager: [phone] Office: [phone] If no answer Cell [phone]

## 2019-07-12 DIAGNOSIS — G894 Chronic pain syndrome: Secondary | ICD-10-CM | POA: Diagnosis not present

## 2019-07-12 DIAGNOSIS — R519 Headache, unspecified: Secondary | ICD-10-CM | POA: Diagnosis not present

## 2019-07-12 DIAGNOSIS — B0229 Other postherpetic nervous system involvement: Secondary | ICD-10-CM | POA: Diagnosis not present

## 2019-07-21 DIAGNOSIS — H26493 Other secondary cataract, bilateral: Secondary | ICD-10-CM | POA: Diagnosis not present

## 2019-07-21 DIAGNOSIS — H04123 Dry eye syndrome of bilateral lacrimal glands: Secondary | ICD-10-CM | POA: Diagnosis not present

## 2019-07-21 DIAGNOSIS — D3131 Benign neoplasm of right choroid: Secondary | ICD-10-CM | POA: Diagnosis not present

## 2019-08-03 ENCOUNTER — Other Ambulatory Visit: Payer: Self-pay

## 2019-09-06 DIAGNOSIS — Z23 Encounter for immunization: Secondary | ICD-10-CM | POA: Diagnosis not present

## 2019-09-12 DIAGNOSIS — Z79899 Other long term (current) drug therapy: Secondary | ICD-10-CM | POA: Diagnosis not present

## 2019-09-12 DIAGNOSIS — Z79891 Long term (current) use of opiate analgesic: Secondary | ICD-10-CM | POA: Diagnosis not present

## 2019-09-12 DIAGNOSIS — G894 Chronic pain syndrome: Secondary | ICD-10-CM | POA: Diagnosis not present

## 2019-10-10 DIAGNOSIS — Z23 Encounter for immunization: Secondary | ICD-10-CM | POA: Diagnosis not present

## 2019-10-11 DIAGNOSIS — J0110 Acute frontal sinusitis, unspecified: Secondary | ICD-10-CM | POA: Diagnosis not present

## 2019-10-11 DIAGNOSIS — R042 Hemoptysis: Secondary | ICD-10-CM | POA: Diagnosis not present

## 2019-10-11 DIAGNOSIS — J449 Chronic obstructive pulmonary disease, unspecified: Secondary | ICD-10-CM | POA: Diagnosis not present

## 2019-10-13 ENCOUNTER — Ambulatory Visit (INDEPENDENT_AMBULATORY_CARE_PROVIDER_SITE_OTHER): Payer: Medicare Other | Admitting: Cardiology

## 2019-10-13 ENCOUNTER — Encounter: Payer: Self-pay | Admitting: Cardiology

## 2019-10-13 ENCOUNTER — Other Ambulatory Visit: Payer: Self-pay

## 2019-10-13 VITALS — BP 121/68 | HR 67 | Temp 97.9°F | Ht 63.0 in | Wt 150.1 lb

## 2019-10-13 DIAGNOSIS — R0609 Other forms of dyspnea: Secondary | ICD-10-CM

## 2019-10-13 DIAGNOSIS — R0600 Dyspnea, unspecified: Secondary | ICD-10-CM | POA: Diagnosis not present

## 2019-10-13 DIAGNOSIS — I10 Essential (primary) hypertension: Secondary | ICD-10-CM | POA: Diagnosis not present

## 2019-10-13 NOTE — Progress Notes (Signed)
Subjective:   Faith Ritter, female    DOB: [birth year], 84 y.o.   MRN: [id number]    Chief complaint:  Hypertension   HPI  84 year old female with hypertension, hyperlipidemia, remote history of pulmonary embolism, migraine and depression.  Shortness of breath has improved with increasing physical activity.y She has no complaints today.   Current Outpatient Medications on File Prior to Visit  Medication Sig Dispense Refill  . amLODipine (NORVASC) 5 MG tablet Take 1 tablet (5 mg total) by mouth daily. 90 tablet 3  . azithromycin (ZITHROMAX) 250 MG tablet Take by mouth as directed.    . Calcium-Phosphorus-Vitamin D (CITRACAL +D3 PO) Take 1 tablet by mouth 2 (two) times daily.    . fenofibrate (TRICOR) 48 MG tablet Take 48 mg by mouth every evening. Reported on 09/22/2015    . gabapentin (NEURONTIN) 300 MG capsule Take 1 capsule by mouth. 2-3 x daily    . isosorbide-hydrALAZINE (BIDIL) 20-37.5 MG per tablet Take 1 tablet by mouth 3 (three) times daily.     Marland Kitchen l-methylfolate-B6-B12 (METANX) 3-35-2 MG TABS tablet Take 1 tablet by mouth daily.    Marland Kitchen losartan-hydrochlorothiazide (HYZAAR) 100-25 MG per tablet Take 1 tablet by mouth daily.     . metoprolol succinate (TOPROL-XL) 50 MG 24 hr tablet Take 50 mg by mouth daily. Take with or immediately following a meal.    . montelukast (SINGULAIR) 10 MG tablet Take 10 mg by mouth at bedtime as needed.     . Multiple Vitamin (MULTIVITAMIN) capsule Take 1 capsule by mouth daily.    . traMADol (ULTRAM) 50 MG tablet Take 1 tablet by mouth daily.     No current facility-administered medications on file prior to visit.    Cardiovascular studies:  EKG 07/11/2019: Sinus rhythm 54 bpm. First degree A-V block. PR interval 240 msec.   Carotid artery duplex 06/25/2018: No hemodynamically significant arterial disease in the internal carotid artery bilaterally. No significant plaque burden. Antegrade right vertebral artery flow. Antegrade left  vertebral artery flow. No significant change 06/04/10.  Echocardiogram 10/29/2016: Left ventricle cavity is normal in size. Normal global wall motion. Normal diastolic filling pattern, normal LAP. Calculated EF 66%. Right ventricle cavity is normal in size. Normal right ventricular function. A moderateor band is noted at the RV apex (normal variant). Mild (Grade I) mitral regurgitation. Trace tricuspid regurgitation. No evidence of pulmonary hypertension, pressure may be underestimated. IVC is normal with poor inspiration collapse consistent with elevated right atrial pressure. Compared to 06/04/2010, suggestion of increased right heart pressure is new.  Lexiscan myoview stress test 10/31/2016: 1. Resting EKG demonstrates normal sinus rhythm, normal axis, poor R-wave progression, borderline low voltage complexes.  Stress EKG is non-diagnostic for ischemia and sits a pharmacologic stress test.  There are occasional PACs and PVCs, brief episodes of atrial tachycardia.  Symptoms included lightheadedness. 2. Myocardial perfusion imaging is normal. Overall left ventricular systolic function was normal without regional wall motion abnormalities. The left ventricular ejection fraction was 68%.  Recent labs: Labs 10/14/2017: H/H 12.8/37.8.  MCV 91.  Platelets 251 Glucose 103.  BUN/creatinine 22/0.89.  EGFR >60.  Sodium 134, potassium 3.6. Troponin <0.03 X 2  D-dimer 1.16, elevated   Review of Systems  Cardiovascular: Negative for chest pain, dyspnea on exertion, leg swelling, palpitations and syncope.        Vitals:   10/13/19 1140  BP: 121/68  Pulse: 67  Temp: 97.9 F (36.6 C)  SpO2: 96%  Objective:     Physical exam: Physical Exam  Constitutional: She appears well-developed and well-nourished.  Neck: No JVD present.  Cardiovascular: Normal rate, regular rhythm and intact distal pulses.  Murmur heard.  Holosystolic murmur is present with a grade of 2/6 at the upper right  sternal border. Pulmonary/Chest: Effort normal and breath sounds normal. She has no wheezes. She has no rales.  Musculoskeletal:        General: No edema.  Nursing note and vitals reviewed.         Assessment & Recommendations:   84 year old female with resistant hypertension, hyperlipidemia, remote history of pulmonary embolism, migraine and depression.  Hypertension: Well controlled on current therapy. No change made today. Will check echocardiogram for systolic murmur.  Exertional dyspnea: Resolved.  F/u in 1 year.   Nigel Mormon, MD [facility] Pager: [phone] Office: [phone] If no answer Cell [phone]

## 2019-10-18 ENCOUNTER — Ambulatory Visit: Payer: Medicare Other

## 2019-10-18 ENCOUNTER — Other Ambulatory Visit: Payer: Self-pay

## 2019-10-18 DIAGNOSIS — I10 Essential (primary) hypertension: Secondary | ICD-10-CM | POA: Diagnosis not present

## 2019-10-20 NOTE — Progress Notes (Signed)
Called patient, LMAM.

## 2019-10-20 NOTE — Progress Notes (Signed)
Patient called back and I spoke with her regarding her echocardiogram results.

## 2019-11-18 DIAGNOSIS — G894 Chronic pain syndrome: Secondary | ICD-10-CM | POA: Diagnosis not present

## 2019-11-18 DIAGNOSIS — B0229 Other postherpetic nervous system involvement: Secondary | ICD-10-CM | POA: Diagnosis not present

## 2019-11-28 DIAGNOSIS — I10 Essential (primary) hypertension: Secondary | ICD-10-CM | POA: Diagnosis not present

## 2019-11-28 DIAGNOSIS — E7800 Pure hypercholesterolemia, unspecified: Secondary | ICD-10-CM | POA: Diagnosis not present

## 2020-01-11 ENCOUNTER — Other Ambulatory Visit: Payer: Self-pay | Admitting: Cardiology

## 2020-01-11 DIAGNOSIS — I10 Essential (primary) hypertension: Secondary | ICD-10-CM | POA: Diagnosis not present

## 2020-01-11 DIAGNOSIS — Z0000 Encounter for general adult medical examination without abnormal findings: Secondary | ICD-10-CM | POA: Diagnosis not present

## 2020-01-11 DIAGNOSIS — E7800 Pure hypercholesterolemia, unspecified: Secondary | ICD-10-CM | POA: Diagnosis not present

## 2020-01-27 DIAGNOSIS — B0229 Other postherpetic nervous system involvement: Secondary | ICD-10-CM | POA: Diagnosis not present

## 2020-01-27 DIAGNOSIS — Z79891 Long term (current) use of opiate analgesic: Secondary | ICD-10-CM | POA: Diagnosis not present

## 2020-01-27 DIAGNOSIS — G894 Chronic pain syndrome: Secondary | ICD-10-CM | POA: Diagnosis not present

## 2020-01-27 DIAGNOSIS — Z79899 Other long term (current) drug therapy: Secondary | ICD-10-CM | POA: Diagnosis not present

## 2020-02-17 ENCOUNTER — Telehealth: Payer: Self-pay

## 2020-02-17 MED ORDER — AMLODIPINE BESYLATE 2.5 MG PO TABS
2.5000 mg | ORAL_TABLET | Freq: Every day | ORAL | 3 refills | Status: DC
Start: 2020-02-17 — End: 2020-10-12

## 2020-02-17 NOTE — Telephone Encounter (Signed)
Yes. Can be cut in half (down from 5 mg to 2.5 mg). Should check BP at home or with PCP in next 1-2 months.  Thanks MJP

## 2020-02-17 NOTE — Telephone Encounter (Signed)
Feels Amlodipine is making feet swell. Patient wants to know if the dose should be reduced.

## 2020-02-17 NOTE — Telephone Encounter (Signed)
Spoke with patient she agrees Dr.Patwardhan's recommendation of taking Amlodipine 2.5mg . She will follow up with her PCP in the future

## 2020-02-21 DIAGNOSIS — Z85820 Personal history of malignant melanoma of skin: Secondary | ICD-10-CM | POA: Diagnosis not present

## 2020-02-21 DIAGNOSIS — D1801 Hemangioma of skin and subcutaneous tissue: Secondary | ICD-10-CM | POA: Diagnosis not present

## 2020-02-21 DIAGNOSIS — L905 Scar conditions and fibrosis of skin: Secondary | ICD-10-CM | POA: Diagnosis not present

## 2020-02-21 DIAGNOSIS — L814 Other melanin hyperpigmentation: Secondary | ICD-10-CM | POA: Diagnosis not present

## 2020-02-21 DIAGNOSIS — D229 Melanocytic nevi, unspecified: Secondary | ICD-10-CM | POA: Diagnosis not present

## 2020-02-21 DIAGNOSIS — L821 Other seborrheic keratosis: Secondary | ICD-10-CM | POA: Diagnosis not present

## 2020-02-21 DIAGNOSIS — L720 Epidermal cyst: Secondary | ICD-10-CM | POA: Diagnosis not present

## 2020-04-06 DIAGNOSIS — B0229 Other postherpetic nervous system involvement: Secondary | ICD-10-CM | POA: Diagnosis not present

## 2020-04-06 DIAGNOSIS — G894 Chronic pain syndrome: Secondary | ICD-10-CM | POA: Diagnosis not present

## 2020-06-15 DIAGNOSIS — B0222 Postherpetic trigeminal neuralgia: Secondary | ICD-10-CM | POA: Diagnosis not present

## 2020-06-15 DIAGNOSIS — Z79899 Other long term (current) drug therapy: Secondary | ICD-10-CM | POA: Diagnosis not present

## 2020-06-15 DIAGNOSIS — Z79891 Long term (current) use of opiate analgesic: Secondary | ICD-10-CM | POA: Diagnosis not present

## 2020-06-15 DIAGNOSIS — G894 Chronic pain syndrome: Secondary | ICD-10-CM | POA: Diagnosis not present

## 2020-06-15 DIAGNOSIS — R519 Headache, unspecified: Secondary | ICD-10-CM | POA: Diagnosis not present

## 2020-07-03 DIAGNOSIS — Z23 Encounter for immunization: Secondary | ICD-10-CM | POA: Diagnosis not present

## 2020-07-06 DIAGNOSIS — E7800 Pure hypercholesterolemia, unspecified: Secondary | ICD-10-CM | POA: Diagnosis not present

## 2020-07-06 DIAGNOSIS — I10 Essential (primary) hypertension: Secondary | ICD-10-CM | POA: Diagnosis not present

## 2020-07-06 DIAGNOSIS — R739 Hyperglycemia, unspecified: Secondary | ICD-10-CM | POA: Diagnosis not present

## 2020-07-13 DIAGNOSIS — R1310 Dysphagia, unspecified: Secondary | ICD-10-CM | POA: Diagnosis not present

## 2020-07-13 DIAGNOSIS — J301 Allergic rhinitis due to pollen: Secondary | ICD-10-CM | POA: Diagnosis not present

## 2020-07-13 DIAGNOSIS — I10 Essential (primary) hypertension: Secondary | ICD-10-CM | POA: Diagnosis not present

## 2020-07-13 DIAGNOSIS — B0229 Other postherpetic nervous system involvement: Secondary | ICD-10-CM | POA: Diagnosis not present

## 2020-07-13 DIAGNOSIS — E7800 Pure hypercholesterolemia, unspecified: Secondary | ICD-10-CM | POA: Diagnosis not present

## 2020-07-13 DIAGNOSIS — I341 Nonrheumatic mitral (valve) prolapse: Secondary | ICD-10-CM | POA: Diagnosis not present

## 2020-07-13 DIAGNOSIS — M810 Age-related osteoporosis without current pathological fracture: Secondary | ICD-10-CM | POA: Diagnosis not present

## 2020-07-13 DIAGNOSIS — K146 Glossodynia: Secondary | ICD-10-CM | POA: Diagnosis not present

## 2020-07-23 DIAGNOSIS — D3131 Benign neoplasm of right choroid: Secondary | ICD-10-CM | POA: Diagnosis not present

## 2020-07-23 DIAGNOSIS — H524 Presbyopia: Secondary | ICD-10-CM | POA: Diagnosis not present

## 2020-07-23 DIAGNOSIS — H26493 Other secondary cataract, bilateral: Secondary | ICD-10-CM | POA: Diagnosis not present

## 2020-07-23 DIAGNOSIS — H04123 Dry eye syndrome of bilateral lacrimal glands: Secondary | ICD-10-CM | POA: Diagnosis not present

## 2020-08-16 DIAGNOSIS — B0229 Other postherpetic nervous system involvement: Secondary | ICD-10-CM | POA: Diagnosis not present

## 2020-08-16 DIAGNOSIS — R519 Headache, unspecified: Secondary | ICD-10-CM | POA: Diagnosis not present

## 2020-08-16 DIAGNOSIS — G894 Chronic pain syndrome: Secondary | ICD-10-CM | POA: Diagnosis not present

## 2020-10-10 DIAGNOSIS — S82425D Nondisplaced transverse fracture of shaft of left fibula, subsequent encounter for closed fracture with routine healing: Secondary | ICD-10-CM | POA: Diagnosis not present

## 2020-10-11 DIAGNOSIS — Z79891 Long term (current) use of opiate analgesic: Secondary | ICD-10-CM | POA: Diagnosis not present

## 2020-10-11 DIAGNOSIS — B0229 Other postherpetic nervous system involvement: Secondary | ICD-10-CM | POA: Diagnosis not present

## 2020-10-11 DIAGNOSIS — Z79899 Other long term (current) drug therapy: Secondary | ICD-10-CM | POA: Diagnosis not present

## 2020-10-11 DIAGNOSIS — G894 Chronic pain syndrome: Secondary | ICD-10-CM | POA: Diagnosis not present

## 2020-10-12 ENCOUNTER — Other Ambulatory Visit: Payer: Self-pay

## 2020-10-12 ENCOUNTER — Encounter: Payer: Self-pay | Admitting: Cardiology

## 2020-10-12 ENCOUNTER — Ambulatory Visit: Payer: Medicare Other | Admitting: Cardiology

## 2020-10-12 VITALS — BP 148/78 | HR 65 | Temp 98.1°F | Resp 16 | Ht 63.0 in | Wt 158.4 lb

## 2020-10-12 DIAGNOSIS — I10 Essential (primary) hypertension: Secondary | ICD-10-CM | POA: Diagnosis not present

## 2020-10-12 DIAGNOSIS — R0989 Other specified symptoms and signs involving the circulatory and respiratory systems: Secondary | ICD-10-CM | POA: Insufficient documentation

## 2020-10-12 MED ORDER — AMLODIPINE BESYLATE 10 MG PO TABS: 10.0000 mg | ORAL_TABLET | Freq: Every day | ORAL | 3 refills | Status: DC

## 2020-10-12 NOTE — Progress Notes (Signed)
Subjective:   Faith Ritter, female    DOB: [birth year], 85 y.o.   MRN: [id number]    Chief complaint:  Hypertension   HPI  85 year old female with hypertension, hyperlipidemia, remote history of pulmonary embolism, migraine and depression.  Patient is doing well. She denies chest pain, shortness of breath, palpitations, leg edema, orthopnea, PND, TIA/syncope. She recently broke her left foot, currently wearing a brace.  Blood pressure is elevated today.   Current Outpatient Medications on File Prior to Visit  Medication Sig Dispense Refill  . amLODipine (NORVASC) 2.5 MG tablet Take 1 tablet (2.5 mg total) by mouth daily. 180 tablet 3  . azithromycin (ZITHROMAX) 250 MG tablet Take by mouth as directed.    . Calcium-Phosphorus-Vitamin D (CITRACAL +D3 PO) Take 1 tablet by mouth 2 (two) times daily.    . fenofibrate (TRICOR) 48 MG tablet Take 48 mg by mouth every evening. Reported on 09/22/2015    . gabapentin (NEURONTIN) 300 MG capsule Take 1 capsule by mouth. 2-3 x daily    . isosorbide-hydrALAZINE (BIDIL) 20-37.5 MG per tablet Take 1 tablet by mouth 3 (three) times daily.     Marland Kitchen l-methylfolate-B6-B12 (METANX) 3-35-2 MG TABS tablet Take 1 tablet by mouth daily.    Marland Kitchen losartan-hydrochlorothiazide (HYZAAR) 100-25 MG per tablet Take 1 tablet by mouth daily.     . metoprolol succinate (TOPROL-XL) 50 MG 24 hr tablet Take 50 mg by mouth daily. Take with or immediately following a meal.    . montelukast (SINGULAIR) 10 MG tablet Take 10 mg by mouth at bedtime as needed.     . Multiple Vitamin (MULTIVITAMIN) capsule Take 1 capsule by mouth daily.    . traMADol (ULTRAM) 50 MG tablet Take 1 tablet by mouth daily.     No current facility-administered medications on file prior to visit.    Cardiovascular studies:  EKG 10/12/2020: Sinus rhythm 57 bpm First degree A-V block  Nonspecific T wave changes  Echocardiogram 10/18/2019: 1. Normal left ventricular systolic function, estimated  LVEF 55-60%,  calculated LVEF 58%.  2. Normal global wall motion.  3. No obvious regional wall motion abnormalities.  4. Dilated left atrium.  5. Mild tricuspid regurgitation.  6. Mild mitral regurgitation.  7. No pulmonary hypertension, RVSP 20 mmHg.  8. No evidence of pericardial effusion.  9. IVC is dilated with normal respiratory variation.  10. Prior study dated 10/29/2016: LVEF 66%, mild MR, trace TR, no pulmonary  hypertension, diastolic function, normal global wall motion.  EKG 07/11/2019: Sinus rhythm 54 bpm. First degree A-V block. PR interval 240 msec.   Lexiscan myoview stress test 10/31/2016: 1. Resting EKG demonstrates normal sinus rhythm, normal axis, poor R-wave progression, borderline low voltage complexes.  Stress EKG is non-diagnostic for ischemia and sits a pharmacologic stress test.  There are occasional PACs and PVCs, brief episodes of atrial tachycardia.  Symptoms included lightheadedness. 2. Myocardial perfusion imaging is normal. Overall left ventricular systolic function was normal without regional wall motion abnormalities. The left ventricular ejection fraction was 68%.  Recent labs: Labs 10/14/2017: H/H 12.8/37.8.  MCV 91.  Platelets 251 Glucose 103.  BUN/creatinine 22/0.89.  EGFR >60.  Sodium 134, potassium 3.6. Troponin <0.03 X 2  D-dimer 1.16, elevated   Review of Systems  Cardiovascular: Negative for chest pain, dyspnea on exertion, leg swelling, palpitations and syncope.        Vitals:   10/12/20 1026  BP: (!) 148/78  Pulse: 65  Resp: 16  Temp:  98.1 F (36.7 C)  SpO2: 96%     Objective:     Physical exam: Physical Exam Vitals and nursing note reviewed.  Constitutional:      Appearance: She is well-developed.  Neck:     Vascular: No JVD.  Cardiovascular:     Rate and Rhythm: Normal rate and regular rhythm.     Pulses: Intact distal pulses.     Heart sounds: Murmur heard.   Holosystolic murmur is present with a grade of 2/6 at  the upper right sternal border.   Pulmonary:     Effort: Pulmonary effort is normal.     Breath sounds: Normal breath sounds. No wheezing or rales.           Assessment & Recommendations:   85 year old female with resistant hypertension, hyperlipidemia, remote history of pulmonary embolism, migraine and depression.  Hypertension: BP elevated today. She currently takes amlodipine 5 mg in am, 2.5 mg in pm Will switch to amlodipine 10 mg daily  Carotid bruit: Will check carotid duplex. Previously normal in 2019.  F/u in 1 year.   Nigel Mormon, MD [facility] Pager: [phone] Office: [phone] If no answer Cell [phone]

## 2020-10-23 ENCOUNTER — Other Ambulatory Visit: Payer: Self-pay

## 2020-10-23 ENCOUNTER — Ambulatory Visit: Payer: Medicare Other

## 2020-10-23 DIAGNOSIS — R0989 Other specified symptoms and signs involving the circulatory and respiratory systems: Secondary | ICD-10-CM

## 2020-10-23 DIAGNOSIS — R221 Localized swelling, mass and lump, neck: Secondary | ICD-10-CM

## 2020-10-31 DIAGNOSIS — S82425D Nondisplaced transverse fracture of shaft of left fibula, subsequent encounter for closed fracture with routine healing: Secondary | ICD-10-CM | POA: Diagnosis not present

## 2020-11-02 NOTE — Progress Notes (Signed)
No auth req'd. Spoke with pt and she was told to call [place] [facility] to get test sch

## 2020-11-05 ENCOUNTER — Ambulatory Visit
Admission: RE | Admit: 2020-11-05 | Discharge: 2020-11-05 | Disposition: A | Discharge status: Home / Self Care | Payer: Medicare Other | Source: Ambulatory Visit | Attending: Cardiology | Admitting: Cardiology

## 2020-11-05 ENCOUNTER — Other Ambulatory Visit: Payer: Medicare Other

## 2020-11-05 DIAGNOSIS — E041 Nontoxic single thyroid nodule: Secondary | ICD-10-CM | POA: Diagnosis not present

## 2020-11-05 DIAGNOSIS — R221 Localized swelling, mass and lump, neck: Secondary | ICD-10-CM

## 2020-11-08 DIAGNOSIS — Z79891 Long term (current) use of opiate analgesic: Secondary | ICD-10-CM | POA: Diagnosis not present

## 2020-11-08 DIAGNOSIS — R519 Headache, unspecified: Secondary | ICD-10-CM | POA: Diagnosis not present

## 2020-11-08 DIAGNOSIS — G894 Chronic pain syndrome: Secondary | ICD-10-CM | POA: Diagnosis not present

## 2020-11-08 DIAGNOSIS — Z79899 Other long term (current) drug therapy: Secondary | ICD-10-CM | POA: Diagnosis not present

## 2020-11-08 DIAGNOSIS — E041 Nontoxic single thyroid nodule: Secondary | ICD-10-CM | POA: Diagnosis not present

## 2020-11-08 DIAGNOSIS — I10 Essential (primary) hypertension: Secondary | ICD-10-CM | POA: Diagnosis not present

## 2020-11-08 DIAGNOSIS — E7800 Pure hypercholesterolemia, unspecified: Secondary | ICD-10-CM | POA: Diagnosis not present

## 2020-11-08 DIAGNOSIS — M810 Age-related osteoporosis without current pathological fracture: Secondary | ICD-10-CM | POA: Diagnosis not present

## 2020-11-08 DIAGNOSIS — B0229 Other postherpetic nervous system involvement: Secondary | ICD-10-CM | POA: Diagnosis not present

## 2020-11-28 DIAGNOSIS — S82425D Nondisplaced transverse fracture of shaft of left fibula, subsequent encounter for closed fracture with routine healing: Secondary | ICD-10-CM | POA: Diagnosis not present

## 2020-12-06 DIAGNOSIS — G894 Chronic pain syndrome: Secondary | ICD-10-CM | POA: Diagnosis not present

## 2020-12-06 DIAGNOSIS — B0229 Other postherpetic nervous system involvement: Secondary | ICD-10-CM | POA: Diagnosis not present

## 2021-01-15 DIAGNOSIS — G894 Chronic pain syndrome: Secondary | ICD-10-CM | POA: Diagnosis not present

## 2021-01-15 DIAGNOSIS — Z79891 Long term (current) use of opiate analgesic: Secondary | ICD-10-CM | POA: Diagnosis not present

## 2021-01-15 DIAGNOSIS — Z79899 Other long term (current) drug therapy: Secondary | ICD-10-CM | POA: Diagnosis not present

## 2021-01-15 DIAGNOSIS — B0229 Other postherpetic nervous system involvement: Secondary | ICD-10-CM | POA: Diagnosis not present

## 2021-01-29 DIAGNOSIS — Z23 Encounter for immunization: Secondary | ICD-10-CM | POA: Diagnosis not present

## 2021-02-05 DIAGNOSIS — E7800 Pure hypercholesterolemia, unspecified: Secondary | ICD-10-CM | POA: Diagnosis not present

## 2021-02-05 DIAGNOSIS — E041 Nontoxic single thyroid nodule: Secondary | ICD-10-CM | POA: Diagnosis not present

## 2021-02-05 DIAGNOSIS — I10 Essential (primary) hypertension: Secondary | ICD-10-CM | POA: Diagnosis not present

## 2021-02-05 DIAGNOSIS — Z20822 Contact with and (suspected) exposure to covid-19: Secondary | ICD-10-CM | POA: Diagnosis not present

## 2021-02-12 DIAGNOSIS — E7800 Pure hypercholesterolemia, unspecified: Secondary | ICD-10-CM | POA: Diagnosis not present

## 2021-02-12 DIAGNOSIS — Z0000 Encounter for general adult medical examination without abnormal findings: Secondary | ICD-10-CM | POA: Diagnosis not present

## 2021-02-12 DIAGNOSIS — I340 Nonrheumatic mitral (valve) insufficiency: Secondary | ICD-10-CM | POA: Diagnosis not present

## 2021-02-12 DIAGNOSIS — I129 Hypertensive chronic kidney disease with stage 1 through stage 4 chronic kidney disease, or unspecified chronic kidney disease: Secondary | ICD-10-CM | POA: Diagnosis not present

## 2021-02-12 DIAGNOSIS — B0229 Other postherpetic nervous system involvement: Secondary | ICD-10-CM | POA: Diagnosis not present

## 2021-02-19 DIAGNOSIS — G894 Chronic pain syndrome: Secondary | ICD-10-CM | POA: Diagnosis not present

## 2021-02-19 DIAGNOSIS — B0229 Other postherpetic nervous system involvement: Secondary | ICD-10-CM | POA: Diagnosis not present

## 2021-02-20 DIAGNOSIS — L814 Other melanin hyperpigmentation: Secondary | ICD-10-CM | POA: Diagnosis not present

## 2021-02-20 DIAGNOSIS — Z85820 Personal history of malignant melanoma of skin: Secondary | ICD-10-CM | POA: Diagnosis not present

## 2021-02-20 DIAGNOSIS — L720 Epidermal cyst: Secondary | ICD-10-CM | POA: Diagnosis not present

## 2021-02-20 DIAGNOSIS — L905 Scar conditions and fibrosis of skin: Secondary | ICD-10-CM | POA: Diagnosis not present

## 2021-02-20 DIAGNOSIS — D229 Melanocytic nevi, unspecified: Secondary | ICD-10-CM | POA: Diagnosis not present

## 2021-02-20 DIAGNOSIS — L821 Other seborrheic keratosis: Secondary | ICD-10-CM | POA: Diagnosis not present

## 2021-02-20 DIAGNOSIS — L812 Freckles: Secondary | ICD-10-CM | POA: Diagnosis not present

## 2021-02-20 DIAGNOSIS — I8393 Asymptomatic varicose veins of bilateral lower extremities: Secondary | ICD-10-CM | POA: Diagnosis not present

## 2021-03-06 DIAGNOSIS — Z20822 Contact with and (suspected) exposure to covid-19: Secondary | ICD-10-CM | POA: Diagnosis not present

## 2021-04-05 DIAGNOSIS — Z20822 Contact with and (suspected) exposure to covid-19: Secondary | ICD-10-CM | POA: Diagnosis not present

## 2021-04-18 DIAGNOSIS — B0229 Other postherpetic nervous system involvement: Secondary | ICD-10-CM | POA: Diagnosis not present

## 2021-04-18 DIAGNOSIS — G894 Chronic pain syndrome: Secondary | ICD-10-CM | POA: Diagnosis not present

## 2021-05-22 DIAGNOSIS — Z23 Encounter for immunization: Secondary | ICD-10-CM | POA: Diagnosis not present

## 2021-05-23 DIAGNOSIS — Z20822 Contact with and (suspected) exposure to covid-19: Secondary | ICD-10-CM | POA: Diagnosis not present

## 2021-06-07 DIAGNOSIS — I129 Hypertensive chronic kidney disease with stage 1 through stage 4 chronic kidney disease, or unspecified chronic kidney disease: Secondary | ICD-10-CM | POA: Diagnosis not present

## 2021-06-13 DIAGNOSIS — G894 Chronic pain syndrome: Secondary | ICD-10-CM | POA: Diagnosis not present

## 2021-06-13 DIAGNOSIS — B0229 Other postherpetic nervous system involvement: Secondary | ICD-10-CM | POA: Diagnosis not present

## 2021-06-14 DIAGNOSIS — N1832 Chronic kidney disease, stage 3b: Secondary | ICD-10-CM | POA: Diagnosis not present

## 2021-06-14 DIAGNOSIS — R739 Hyperglycemia, unspecified: Secondary | ICD-10-CM | POA: Diagnosis not present

## 2021-06-14 DIAGNOSIS — I340 Nonrheumatic mitral (valve) insufficiency: Secondary | ICD-10-CM | POA: Diagnosis not present

## 2021-06-14 DIAGNOSIS — Z23 Encounter for immunization: Secondary | ICD-10-CM | POA: Diagnosis not present

## 2021-06-14 DIAGNOSIS — E7800 Pure hypercholesterolemia, unspecified: Secondary | ICD-10-CM | POA: Diagnosis not present

## 2021-06-14 DIAGNOSIS — I129 Hypertensive chronic kidney disease with stage 1 through stage 4 chronic kidney disease, or unspecified chronic kidney disease: Secondary | ICD-10-CM | POA: Diagnosis not present

## 2021-06-14 DIAGNOSIS — E041 Nontoxic single thyroid nodule: Secondary | ICD-10-CM | POA: Diagnosis not present

## 2021-06-14 DIAGNOSIS — B0229 Other postherpetic nervous system involvement: Secondary | ICD-10-CM | POA: Diagnosis not present

## 2021-07-10 DIAGNOSIS — Z20822 Contact with and (suspected) exposure to covid-19: Secondary | ICD-10-CM | POA: Diagnosis not present

## 2021-07-18 DIAGNOSIS — Z20822 Contact with and (suspected) exposure to covid-19: Secondary | ICD-10-CM | POA: Diagnosis not present

## 2021-07-25 DIAGNOSIS — H04123 Dry eye syndrome of bilateral lacrimal glands: Secondary | ICD-10-CM | POA: Diagnosis not present

## 2021-07-25 DIAGNOSIS — D3131 Benign neoplasm of right choroid: Secondary | ICD-10-CM | POA: Diagnosis not present

## 2021-07-25 DIAGNOSIS — H26493 Other secondary cataract, bilateral: Secondary | ICD-10-CM | POA: Diagnosis not present

## 2021-08-05 DIAGNOSIS — Z1159 Encounter for screening for other viral diseases: Secondary | ICD-10-CM | POA: Diagnosis not present

## 2021-08-05 DIAGNOSIS — Z20828 Contact with and (suspected) exposure to other viral communicable diseases: Secondary | ICD-10-CM | POA: Diagnosis not present

## 2021-08-14 DIAGNOSIS — Z20828 Contact with and (suspected) exposure to other viral communicable diseases: Secondary | ICD-10-CM | POA: Diagnosis not present

## 2021-08-14 DIAGNOSIS — Z1159 Encounter for screening for other viral diseases: Secondary | ICD-10-CM | POA: Diagnosis not present

## 2021-08-19 DIAGNOSIS — Z79899 Other long term (current) drug therapy: Secondary | ICD-10-CM | POA: Diagnosis not present

## 2021-08-19 DIAGNOSIS — G894 Chronic pain syndrome: Secondary | ICD-10-CM | POA: Diagnosis not present

## 2021-08-19 DIAGNOSIS — B0229 Other postherpetic nervous system involvement: Secondary | ICD-10-CM | POA: Diagnosis not present

## 2021-08-19 DIAGNOSIS — Z79891 Long term (current) use of opiate analgesic: Secondary | ICD-10-CM | POA: Diagnosis not present

## 2021-09-24 DIAGNOSIS — Z20822 Contact with and (suspected) exposure to covid-19: Secondary | ICD-10-CM | POA: Diagnosis not present

## 2021-09-25 DIAGNOSIS — Z20828 Contact with and (suspected) exposure to other viral communicable diseases: Secondary | ICD-10-CM | POA: Diagnosis not present

## 2021-09-25 DIAGNOSIS — Z1159 Encounter for screening for other viral diseases: Secondary | ICD-10-CM | POA: Diagnosis not present

## 2021-09-28 DIAGNOSIS — Z20822 Contact with and (suspected) exposure to covid-19: Secondary | ICD-10-CM | POA: Diagnosis not present

## 2021-10-02 DIAGNOSIS — H00021 Hordeolum internum right upper eyelid: Secondary | ICD-10-CM | POA: Diagnosis not present

## 2021-10-14 ENCOUNTER — Ambulatory Visit: Payer: Medicare Other | Admitting: Cardiology

## 2021-10-14 ENCOUNTER — Encounter: Payer: Self-pay | Admitting: Student

## 2021-10-14 ENCOUNTER — Other Ambulatory Visit: Payer: Self-pay

## 2021-10-14 ENCOUNTER — Ambulatory Visit: Payer: Medicare Other | Admitting: Student

## 2021-10-14 VITALS — BP 126/71 | HR 66 | Temp 98.0°F | Resp 16 | Ht 63.0 in | Wt 168.0 lb

## 2021-10-14 DIAGNOSIS — I10 Essential (primary) hypertension: Secondary | ICD-10-CM

## 2021-10-14 DIAGNOSIS — E7800 Pure hypercholesterolemia, unspecified: Secondary | ICD-10-CM | POA: Diagnosis not present

## 2021-10-14 NOTE — Progress Notes (Signed)
Subjective:   Faith Ritter, female    DOB: 1936-02-12, 86 y.o.   MRN: [id number]    Chief complaint:  Hypertension   HPI  86 y.o. female with hypertension, hyperlipidemia, remote history of pulmonary embolism, migraine and depression.  Patient presents for annual follow-up.  At last office visit given uncontrolled hypertension amlodipine was increased to 10 mg daily.  Following last office visit she developed bilateral leg edema, therefore PCP changed amlodipine to 5 mg p.o. twice daily.  Patient is tolerating this without issue and leg edema has resolved.  Blood pressure is well controlled.  Patient remains quite active for her age exercising 6 days/week doing activities including tai chi, boxing, and cardio exercises.  Current Outpatient Medications on File Prior to Visit  Medication Sig Dispense Refill   amLODipine (NORVASC) 5 MG tablet Take 5 mg by mouth in the morning and at bedtime.     Calcium-Phosphorus-Vitamin D (CITRACAL +D3 PO) Take 1 tablet by mouth 2 (two) times daily.     fenofibrate (TRICOR) 48 MG tablet Take 48 mg by mouth every evening. Reported on 09/22/2015     gabapentin (NEURONTIN) 300 MG capsule Take 1 capsule by mouth. 2-3 x daily     isosorbide-hydrALAZINE (BIDIL) 20-37.5 MG per tablet Take 1 tablet by mouth 3 (three) times daily.     l-methylfolate-B6-B12 (METANX) 3-35-2 MG TABS tablet Take 1 tablet by mouth daily.     losartan-hydrochlorothiazide (HYZAAR) 100-25 MG per tablet Take 1 tablet by mouth daily.     metoprolol succinate (TOPROL-XL) 50 MG 24 hr tablet Take 50 mg by mouth daily. Take with or immediately following a meal.     montelukast (SINGULAIR) 10 MG tablet Take 10 mg by mouth at bedtime as needed.      Multiple Vitamin (MULTIVITAMIN) capsule Take 1 capsule by mouth daily.     traMADol (ULTRAM) 50 MG tablet Take 1 tablet by mouth daily.     No current facility-administered medications on file prior to visit.    Cardiovascular  studies: EKG 10/14/2021:  Sinus rhythm at a rate of 63 bpm with first-degree AV block.  Normal axis.  Nonspecific T wave abnormality.  Unchanged compared to EKG 10/12/2020.  Ultrasound soft tissue of head and neck 11/05/2020: 1. Minimally complex though benign appearing bilateral thyroid cysts are incompletely evaluated on this non dedicated thyroid ultrasound however do not appear to meet imaging criteria to recommend percutaneous sampling or continued dedicated follow-up. 2. Otherwise, unremarkable soft tissue neck ultrasound.  Carotid artery duplex 10/23/2020:  No hemodynamically significant arterial disease in the internal carotid  artery bilaterally. No significant plaque burden noted.  Antegrade right vertebral artery flow. Antegrade left vertebral artery flow.  There is a 1.48 x 1.94 cm heterogeneous nodule noted near the proximal left common carotid artery, highly recommend ultrasound evaluation by radiology. See image.  EKG 10/12/2020: Sinus rhythm 57 bpm First degree A-V block  Nonspecific T wave changes  Echocardiogram 10/18/2019: 1. Normal left ventricular systolic function, estimated LVEF 55-60%,  calculated LVEF 58%.  2. Normal global wall motion.  3. No obvious regional wall motion abnormalities.  4. Dilated left atrium.  5. Mild tricuspid regurgitation.  6. Mild mitral regurgitation.  7. No pulmonary hypertension, RVSP 20 mmHg.  8. No evidence of pericardial effusion.  9. IVC is dilated with normal respiratory variation.  10. Prior study dated 10/29/2016: LVEF 66%, mild MR, trace TR, no pulmonary  hypertension, diastolic function, normal global wall motion.  EKG 07/11/2019: Sinus rhythm 54 bpm. First degree A-V block. PR interval 240 msec.   Lexiscan myoview stress test 10/31/2016: 1. Resting EKG demonstrates normal sinus rhythm, normal axis, poor R-wave progression, borderline low voltage complexes.  Stress EKG is non-diagnostic for ischemia and sits a pharmacologic  stress test.  There are occasional PACs and PVCs, brief episodes of atrial tachycardia.  Symptoms included lightheadedness. 2. Myocardial perfusion imaging is normal. Overall left ventricular systolic function was normal without regional wall motion abnormalities. The left ventricular ejection fraction was 68%.  Recent labs: 11/28/2019: Hgb 12.5, HCT 38.9, platelet 244, MCV 90 Sodium 142, glucose 100, BUN 23, GFR 50, potassium 4.3, creatinine 1.1 Total cholesterol 163, triglycerides 130, HDL 56, LDL 81, TSH 0.67  10/14/2017: H/H 12.8/37.8.  MCV 91.  Platelets 251 Glucose 103.  BUN/creatinine 22/0.89.  EGFR >60.  Sodium 134, potassium 3.6. Troponin <0.03 X 2  D-dimer 1.16, elevated   Review of Systems  Cardiovascular:  Negative for chest pain, dyspnea on exertion, leg swelling, palpitations and syncope.       Vitals:   10/14/21 1017  BP: 126/71  Pulse: 66  Resp: 16  Temp: 98 F (36.7 C)  SpO2: 96%     Objective:     Physical exam: Physical Exam Vitals and nursing note reviewed.  Constitutional:      Appearance: She is well-developed.  Neck:     Vascular: No JVD.  Cardiovascular:     Rate and Rhythm: Normal rate and regular rhythm.     Pulses: Intact distal pulses.     Heart sounds: Murmur heard.  Holosystolic murmur is present with a grade of 2/6 at the upper right sternal border.  Pulmonary:     Effort: Pulmonary effort is normal.     Breath sounds: Normal breath sounds.  Musculoskeletal:     Right lower leg: No edema.     Left lower leg: No edema.       Assessment & Recommendations:   86 y.o. female with resistant hypertension, hyperlipidemia, remote history of pulmonary embolism, migraine and depression.  Hypertension: Hypertension is well controlled and she is tolerating current medications without issue. Continue amlodipine 5 mg p.o. twice daily Continue BiDil, losartan/hydrochlorothiazide, metoprolol  Carotid bruit: Reviewed and discussed results  of carotid artery duplex which revealed no significant carotid artery stenosis, however did raise concern regarding a nodule noted near the proximal left common carotid.  Patient subsequently underwent ultrasound which was unremarkable.  Patient questions were addressed.  Patient's physical exam and EKG remain unchanged compared to previous.  She is overall stable from a cardiovascular standpoint.  I personally reviewed external labs, lipids are well controlled.  Follow-up in 1 year, sooner if needed.   Alethia Berthold, PA-C 10/14/2021, 11:41 AM Office: [phone]

## 2021-10-21 DIAGNOSIS — B0222 Postherpetic trigeminal neuralgia: Secondary | ICD-10-CM | POA: Diagnosis not present

## 2021-10-21 DIAGNOSIS — R519 Headache, unspecified: Secondary | ICD-10-CM | POA: Diagnosis not present

## 2021-10-21 DIAGNOSIS — B0229 Other postherpetic nervous system involvement: Secondary | ICD-10-CM | POA: Diagnosis not present

## 2021-10-21 DIAGNOSIS — G894 Chronic pain syndrome: Secondary | ICD-10-CM | POA: Diagnosis not present

## 2021-11-01 DIAGNOSIS — Z20828 Contact with and (suspected) exposure to other viral communicable diseases: Secondary | ICD-10-CM | POA: Diagnosis not present

## 2021-11-01 DIAGNOSIS — Z1159 Encounter for screening for other viral diseases: Secondary | ICD-10-CM | POA: Diagnosis not present

## 2021-11-08 DIAGNOSIS — Z20822 Contact with and (suspected) exposure to covid-19: Secondary | ICD-10-CM | POA: Diagnosis not present

## 2021-11-13 DIAGNOSIS — Z20828 Contact with and (suspected) exposure to other viral communicable diseases: Secondary | ICD-10-CM | POA: Diagnosis not present

## 2021-11-13 DIAGNOSIS — Z1159 Encounter for screening for other viral diseases: Secondary | ICD-10-CM | POA: Diagnosis not present

## 2021-12-04 DIAGNOSIS — Z20822 Contact with and (suspected) exposure to covid-19: Secondary | ICD-10-CM | POA: Diagnosis not present

## 2021-12-09 DIAGNOSIS — Z20822 Contact with and (suspected) exposure to covid-19: Secondary | ICD-10-CM | POA: Diagnosis not present

## 2021-12-13 DIAGNOSIS — Z20822 Contact with and (suspected) exposure to covid-19: Secondary | ICD-10-CM | POA: Diagnosis not present

## 2021-12-17 DIAGNOSIS — Z20828 Contact with and (suspected) exposure to other viral communicable diseases: Secondary | ICD-10-CM | POA: Diagnosis not present

## 2021-12-19 DIAGNOSIS — Z20828 Contact with and (suspected) exposure to other viral communicable diseases: Secondary | ICD-10-CM | POA: Diagnosis not present

## 2021-12-19 DIAGNOSIS — Z79891 Long term (current) use of opiate analgesic: Secondary | ICD-10-CM | POA: Diagnosis not present

## 2021-12-19 DIAGNOSIS — Z1159 Encounter for screening for other viral diseases: Secondary | ICD-10-CM | POA: Diagnosis not present

## 2021-12-19 DIAGNOSIS — B0229 Other postherpetic nervous system involvement: Secondary | ICD-10-CM | POA: Diagnosis not present

## 2021-12-22 DIAGNOSIS — Z20822 Contact with and (suspected) exposure to covid-19: Secondary | ICD-10-CM | POA: Diagnosis not present

## 2021-12-26 DIAGNOSIS — Z20822 Contact with and (suspected) exposure to covid-19: Secondary | ICD-10-CM | POA: Diagnosis not present

## 2022-01-02 DIAGNOSIS — Z20822 Contact with and (suspected) exposure to covid-19: Secondary | ICD-10-CM | POA: Diagnosis not present

## 2022-01-04 DIAGNOSIS — Z20822 Contact with and (suspected) exposure to covid-19: Secondary | ICD-10-CM | POA: Diagnosis not present

## 2022-01-13 DIAGNOSIS — Z1159 Encounter for screening for other viral diseases: Secondary | ICD-10-CM | POA: Diagnosis not present

## 2022-01-13 DIAGNOSIS — Z20822 Contact with and (suspected) exposure to covid-19: Secondary | ICD-10-CM | POA: Diagnosis not present

## 2022-01-13 DIAGNOSIS — Z20828 Contact with and (suspected) exposure to other viral communicable diseases: Secondary | ICD-10-CM | POA: Diagnosis not present

## 2022-01-14 DIAGNOSIS — Z20822 Contact with and (suspected) exposure to covid-19: Secondary | ICD-10-CM | POA: Diagnosis not present

## 2022-02-07 DIAGNOSIS — R739 Hyperglycemia, unspecified: Secondary | ICD-10-CM | POA: Diagnosis not present

## 2022-02-07 DIAGNOSIS — E7800 Pure hypercholesterolemia, unspecified: Secondary | ICD-10-CM | POA: Diagnosis not present

## 2022-02-07 DIAGNOSIS — I129 Hypertensive chronic kidney disease with stage 1 through stage 4 chronic kidney disease, or unspecified chronic kidney disease: Secondary | ICD-10-CM | POA: Diagnosis not present

## 2022-02-07 DIAGNOSIS — N1832 Chronic kidney disease, stage 3b: Secondary | ICD-10-CM | POA: Diagnosis not present

## 2022-02-07 DIAGNOSIS — E041 Nontoxic single thyroid nodule: Secondary | ICD-10-CM | POA: Diagnosis not present

## 2022-02-13 DIAGNOSIS — Z79891 Long term (current) use of opiate analgesic: Secondary | ICD-10-CM | POA: Diagnosis not present

## 2022-02-13 DIAGNOSIS — B0229 Other postherpetic nervous system involvement: Secondary | ICD-10-CM | POA: Diagnosis not present

## 2022-02-14 DIAGNOSIS — E7800 Pure hypercholesterolemia, unspecified: Secondary | ICD-10-CM | POA: Diagnosis not present

## 2022-02-14 DIAGNOSIS — Z0000 Encounter for general adult medical examination without abnormal findings: Secondary | ICD-10-CM | POA: Diagnosis not present

## 2022-02-14 DIAGNOSIS — B0229 Other postherpetic nervous system involvement: Secondary | ICD-10-CM | POA: Diagnosis not present

## 2022-02-14 DIAGNOSIS — N1832 Chronic kidney disease, stage 3b: Secondary | ICD-10-CM | POA: Diagnosis not present

## 2022-02-14 DIAGNOSIS — I341 Nonrheumatic mitral (valve) prolapse: Secondary | ICD-10-CM | POA: Diagnosis not present

## 2022-02-14 DIAGNOSIS — Z9109 Other allergy status, other than to drugs and biological substances: Secondary | ICD-10-CM | POA: Diagnosis not present

## 2022-02-14 DIAGNOSIS — Z1159 Encounter for screening for other viral diseases: Secondary | ICD-10-CM | POA: Diagnosis not present

## 2022-02-14 DIAGNOSIS — Z20828 Contact with and (suspected) exposure to other viral communicable diseases: Secondary | ICD-10-CM | POA: Diagnosis not present

## 2022-02-14 DIAGNOSIS — I129 Hypertensive chronic kidney disease with stage 1 through stage 4 chronic kidney disease, or unspecified chronic kidney disease: Secondary | ICD-10-CM | POA: Diagnosis not present

## 2022-02-14 DIAGNOSIS — J019 Acute sinusitis, unspecified: Secondary | ICD-10-CM | POA: Diagnosis not present

## 2022-02-20 DIAGNOSIS — L570 Actinic keratosis: Secondary | ICD-10-CM | POA: Diagnosis not present

## 2022-02-20 DIAGNOSIS — L821 Other seborrheic keratosis: Secondary | ICD-10-CM | POA: Diagnosis not present

## 2022-02-20 DIAGNOSIS — D225 Melanocytic nevi of trunk: Secondary | ICD-10-CM | POA: Diagnosis not present

## 2022-02-20 DIAGNOSIS — L814 Other melanin hyperpigmentation: Secondary | ICD-10-CM | POA: Diagnosis not present

## 2022-03-21 DIAGNOSIS — Z20828 Contact with and (suspected) exposure to other viral communicable diseases: Secondary | ICD-10-CM | POA: Diagnosis not present

## 2022-03-21 DIAGNOSIS — Z1159 Encounter for screening for other viral diseases: Secondary | ICD-10-CM | POA: Diagnosis not present

## 2022-04-15 DIAGNOSIS — B0229 Other postherpetic nervous system involvement: Secondary | ICD-10-CM | POA: Diagnosis not present

## 2022-04-15 DIAGNOSIS — Z79891 Long term (current) use of opiate analgesic: Secondary | ICD-10-CM | POA: Diagnosis not present

## 2022-04-15 DIAGNOSIS — G894 Chronic pain syndrome: Secondary | ICD-10-CM | POA: Diagnosis not present

## 2022-05-06 DIAGNOSIS — Z1159 Encounter for screening for other viral diseases: Secondary | ICD-10-CM | POA: Diagnosis not present

## 2022-05-06 DIAGNOSIS — Z20828 Contact with and (suspected) exposure to other viral communicable diseases: Secondary | ICD-10-CM | POA: Diagnosis not present

## 2022-05-16 DIAGNOSIS — Z1159 Encounter for screening for other viral diseases: Secondary | ICD-10-CM | POA: Diagnosis not present

## 2022-05-16 DIAGNOSIS — Z20828 Contact with and (suspected) exposure to other viral communicable diseases: Secondary | ICD-10-CM | POA: Diagnosis not present

## 2022-06-03 DIAGNOSIS — R531 Weakness: Secondary | ICD-10-CM | POA: Diagnosis not present

## 2022-06-03 DIAGNOSIS — Z03818 Encounter for observation for suspected exposure to other biological agents ruled out: Secondary | ICD-10-CM | POA: Diagnosis not present

## 2022-06-03 DIAGNOSIS — R051 Acute cough: Secondary | ICD-10-CM | POA: Diagnosis not present

## 2022-06-03 DIAGNOSIS — R0602 Shortness of breath: Secondary | ICD-10-CM | POA: Diagnosis not present

## 2022-06-13 DIAGNOSIS — I129 Hypertensive chronic kidney disease with stage 1 through stage 4 chronic kidney disease, or unspecified chronic kidney disease: Secondary | ICD-10-CM | POA: Diagnosis not present

## 2022-06-13 DIAGNOSIS — E7800 Pure hypercholesterolemia, unspecified: Secondary | ICD-10-CM | POA: Diagnosis not present

## 2022-06-13 DIAGNOSIS — R739 Hyperglycemia, unspecified: Secondary | ICD-10-CM | POA: Diagnosis not present

## 2022-06-13 DIAGNOSIS — N1832 Chronic kidney disease, stage 3b: Secondary | ICD-10-CM | POA: Diagnosis not present

## 2022-06-13 DIAGNOSIS — E041 Nontoxic single thyroid nodule: Secondary | ICD-10-CM | POA: Diagnosis not present

## 2022-06-17 DIAGNOSIS — I129 Hypertensive chronic kidney disease with stage 1 through stage 4 chronic kidney disease, or unspecified chronic kidney disease: Secondary | ICD-10-CM | POA: Diagnosis not present

## 2022-06-17 DIAGNOSIS — Z9109 Other allergy status, other than to drugs and biological substances: Secondary | ICD-10-CM | POA: Diagnosis not present

## 2022-06-17 DIAGNOSIS — N1832 Chronic kidney disease, stage 3b: Secondary | ICD-10-CM | POA: Diagnosis not present

## 2022-06-17 DIAGNOSIS — E7800 Pure hypercholesterolemia, unspecified: Secondary | ICD-10-CM | POA: Diagnosis not present

## 2022-06-27 DIAGNOSIS — Z20828 Contact with and (suspected) exposure to other viral communicable diseases: Secondary | ICD-10-CM | POA: Diagnosis not present

## 2022-06-27 DIAGNOSIS — Z1159 Encounter for screening for other viral diseases: Secondary | ICD-10-CM | POA: Diagnosis not present

## 2022-07-03 DIAGNOSIS — Z79891 Long term (current) use of opiate analgesic: Secondary | ICD-10-CM | POA: Diagnosis not present

## 2022-07-03 DIAGNOSIS — G894 Chronic pain syndrome: Secondary | ICD-10-CM | POA: Diagnosis not present

## 2022-07-03 DIAGNOSIS — B0229 Other postherpetic nervous system involvement: Secondary | ICD-10-CM | POA: Diagnosis not present

## 2022-07-23 DIAGNOSIS — Z20828 Contact with and (suspected) exposure to other viral communicable diseases: Secondary | ICD-10-CM | POA: Diagnosis not present

## 2022-07-23 DIAGNOSIS — Z1159 Encounter for screening for other viral diseases: Secondary | ICD-10-CM | POA: Diagnosis not present

## 2022-07-25 DIAGNOSIS — H524 Presbyopia: Secondary | ICD-10-CM | POA: Diagnosis not present

## 2022-07-25 DIAGNOSIS — D3131 Benign neoplasm of right choroid: Secondary | ICD-10-CM | POA: Diagnosis not present

## 2022-07-25 DIAGNOSIS — H04123 Dry eye syndrome of bilateral lacrimal glands: Secondary | ICD-10-CM | POA: Diagnosis not present

## 2022-07-25 DIAGNOSIS — H26493 Other secondary cataract, bilateral: Secondary | ICD-10-CM | POA: Diagnosis not present

## 2022-08-13 DIAGNOSIS — H00012 Hordeolum externum right lower eyelid: Secondary | ICD-10-CM | POA: Diagnosis not present

## 2022-08-27 DIAGNOSIS — G894 Chronic pain syndrome: Secondary | ICD-10-CM | POA: Diagnosis not present

## 2022-08-27 DIAGNOSIS — Z79891 Long term (current) use of opiate analgesic: Secondary | ICD-10-CM | POA: Diagnosis not present

## 2022-08-27 DIAGNOSIS — B0229 Other postherpetic nervous system involvement: Secondary | ICD-10-CM | POA: Diagnosis not present

## 2022-10-14 ENCOUNTER — Ambulatory Visit: Payer: Medicare Other | Admitting: Student

## 2022-10-14 DIAGNOSIS — I129 Hypertensive chronic kidney disease with stage 1 through stage 4 chronic kidney disease, or unspecified chronic kidney disease: Secondary | ICD-10-CM | POA: Diagnosis not present

## 2022-10-14 DIAGNOSIS — N1832 Chronic kidney disease, stage 3b: Secondary | ICD-10-CM | POA: Diagnosis not present

## 2022-10-16 ENCOUNTER — Encounter: Payer: Self-pay | Admitting: Cardiology

## 2022-10-16 ENCOUNTER — Ambulatory Visit: Payer: Medicare Other | Admitting: Cardiology

## 2022-10-16 VITALS — BP 106/59 | HR 70 | Resp 16 | Ht 63.0 in | Wt 166.0 lb

## 2022-10-16 DIAGNOSIS — R011 Cardiac murmur, unspecified: Secondary | ICD-10-CM | POA: Diagnosis not present

## 2022-10-16 DIAGNOSIS — I10 Essential (primary) hypertension: Secondary | ICD-10-CM | POA: Diagnosis not present

## 2022-10-16 NOTE — Progress Notes (Signed)
Follow up visit  Subjective:   Faith Ritter, female    DOB: October 11, 1935, 87 y.o.   MRN: [id number]   HPI  Chief Complaint  Patient presents with   Hypertension   Hyperlipidemia   Follow-up    1 year    87 y.o. Caucasian female with hypertension  Patient is doing well. She walks 8,000 steps/day, also does boxing. She denies chest pain, shortness of breath, palpitations, leg edema, orthopnea, PND, TIA/syncope.    Current Outpatient Medications:    amLODipine (NORVASC) 5 MG tablet, Take 5 mg by mouth in the morning and at bedtime., Disp: , Rfl:    fenofibrate (TRICOR) 48 MG tablet, Take 48 mg by mouth every evening. Reported on 09/22/2015, Disp: , Rfl:    gabapentin (NEURONTIN) 600 MG tablet, Take 1 capsule by mouth. 2-3 x daily, Disp: , Rfl:    isosorbide-hydrALAZINE (BIDIL) 20-37.5 MG per tablet, Take 1 tablet by mouth 3 (three) times daily., Disp: , Rfl:    l-methylfolate-B6-B12 (METANX) 3-35-2 MG TABS tablet, Take 1 tablet by mouth daily., Disp: , Rfl:    losartan-hydrochlorothiazide (HYZAAR) 100-25 MG per tablet, Take 1 tablet by mouth daily., Disp: , Rfl:    metoprolol succinate (TOPROL-XL) 50 MG 24 hr tablet, Take 50 mg by mouth daily. Take with or immediately following a meal., Disp: , Rfl:    traMADol (ULTRAM) 50 MG tablet, Take 1 tablet by mouth daily., Disp: , Rfl:    Calcium-Phosphorus-Vitamin D (CITRACAL +D3 PO), Take 1 tablet by mouth 2 (two) times daily., Disp: , Rfl:    montelukast (SINGULAIR) 10 MG tablet, Take 10 mg by mouth at bedtime as needed. , Disp: , Rfl:    Multiple Vitamin (MULTIVITAMIN) capsule, Take 1 capsule by mouth daily., Disp: , Rfl:    Cardiovascular & other pertient studies:  Reviewed external labs and tests, independently interpreted  EKG 10/14/2021:  Sinus rhythm 63 bpm First-degree AV block.   Recent labs: 10/14/2022: Glucose 98, BUN/Cr 26/1.34. EGFR 36. Na/K 135/3.9. Rest of the CMP normal H/H 12/39. MCV 90. Platelets 233   Review of  Systems  Cardiovascular:  Negative for chest pain, dyspnea on exertion, leg swelling, palpitations and syncope.         Vitals:   10/16/22 1254  BP: (!) 106/59  Pulse: 70  Resp: 16  SpO2: 98%    Body mass index is 29.41 kg/m. Filed Weights   10/16/22 1254  Weight: 166 lb (75.3 kg)     Objective:   Physical Exam Vitals and nursing note reviewed.  Constitutional:      General: She is not in acute distress. Neck:     Vascular: No JVD.  Cardiovascular:     Rate and Rhythm: Normal rate and regular rhythm.     Heart sounds: Murmur heard.     Harsh midsystolic murmur is present with a grade of 2/6 at the upper right sternal border radiating to the neck.  Pulmonary:     Effort: Pulmonary effort is normal.     Breath sounds: Normal breath sounds. No wheezing or rales.  Musculoskeletal:     Right lower leg: No edema.     Left lower leg: Edema present.           Visit diagnoses:   ICD-10-CM   1. Essential hypertension  I10 EKG 12-Lead    PCV ECHOCARDIOGRAM COMPLETE    2. Murmur  R01.1 PCV ECHOCARDIOGRAM COMPLETE       Orders Placed  This Encounter  Procedures   EKG 12-Lead     Medication changes this visit: Medications Discontinued During This Encounter  Medication Reason   isosorbide-hydrALAZINE (BIDIL) 20-37.5 MG per tablet Discontinued by provider     Assessment & Recommendations:   87 y.o. Caucasian female with hypertension  Hypertension: On multiple medications. BP low normal. Will try stopping Bidil. F/u in 4 weeks after echocardiogram to reassess      Nigel Mormon, MD Pager: [phone] Office: [phone]

## 2022-10-21 DIAGNOSIS — R7309 Other abnormal glucose: Secondary | ICD-10-CM | POA: Diagnosis not present

## 2022-10-21 DIAGNOSIS — I341 Nonrheumatic mitral (valve) prolapse: Secondary | ICD-10-CM | POA: Diagnosis not present

## 2022-10-21 DIAGNOSIS — I129 Hypertensive chronic kidney disease with stage 1 through stage 4 chronic kidney disease, or unspecified chronic kidney disease: Secondary | ICD-10-CM | POA: Diagnosis not present

## 2022-10-21 DIAGNOSIS — I340 Nonrheumatic mitral (valve) insufficiency: Secondary | ICD-10-CM | POA: Diagnosis not present

## 2022-10-21 DIAGNOSIS — E7800 Pure hypercholesterolemia, unspecified: Secondary | ICD-10-CM | POA: Diagnosis not present

## 2022-10-21 DIAGNOSIS — N1832 Chronic kidney disease, stage 3b: Secondary | ICD-10-CM | POA: Diagnosis not present

## 2022-10-21 DIAGNOSIS — M47812 Spondylosis without myelopathy or radiculopathy, cervical region: Secondary | ICD-10-CM | POA: Diagnosis not present

## 2022-10-21 DIAGNOSIS — B0229 Other postherpetic nervous system involvement: Secondary | ICD-10-CM | POA: Diagnosis not present

## 2022-10-21 DIAGNOSIS — Z79891 Long term (current) use of opiate analgesic: Secondary | ICD-10-CM | POA: Diagnosis not present

## 2022-10-21 DIAGNOSIS — G894 Chronic pain syndrome: Secondary | ICD-10-CM | POA: Diagnosis not present

## 2022-10-22 ENCOUNTER — Other Ambulatory Visit: Payer: Medicare Other

## 2022-10-22 ENCOUNTER — Ambulatory Visit: Payer: Medicare Other

## 2022-10-22 DIAGNOSIS — R011 Cardiac murmur, unspecified: Secondary | ICD-10-CM

## 2022-10-22 DIAGNOSIS — I10 Essential (primary) hypertension: Secondary | ICD-10-CM

## 2022-11-17 ENCOUNTER — Ambulatory Visit: Payer: Medicare Other | Admitting: Cardiology

## 2022-11-27 ENCOUNTER — Encounter: Payer: Self-pay | Admitting: Cardiology

## 2022-11-27 ENCOUNTER — Ambulatory Visit: Payer: Medicare Other | Admitting: Cardiology

## 2022-11-27 VITALS — BP 145/74 | HR 63 | Ht 63.0 in | Wt 161.0 lb

## 2022-11-27 DIAGNOSIS — I10 Essential (primary) hypertension: Secondary | ICD-10-CM | POA: Diagnosis not present

## 2022-11-27 MED ORDER — ISOSORB DINITRATE-HYDRALAZINE 20-37.5 MG PO TABS: 1.0000 | ORAL_TABLET | Freq: Three times a day (TID) | ORAL | 3 refills | Status: DC

## 2022-11-27 NOTE — Progress Notes (Signed)
Follow up visit  Subjective:   Faith Ritter, female    DOB: 11/26/1935, 87 y.o.   MRN: [id number]   HPI  Chief Complaint  Patient presents with   Hypertension   Follow-up    87 y.o. Caucasian female with hypertension  Blood pressure has slowly crept up since stopping Bidil ast last visit. Otherwise, patient is doing well. She walks 8,000 steps/day, also does boxing. She denies chest pain, shortness of breath, palpitations, leg edema, orthopnea, PND, TIA/syncope. Reviewed recent test results with the patient, details below.     Current Outpatient Medications:    amLODipine (NORVASC) 5 MG tablet, Take 5 mg by mouth in the morning and at bedtime., Disp: , Rfl:    Calcium-Phosphorus-Vitamin D (CITRACAL +D3 PO), Take 1 tablet by mouth 2 (two) times daily., Disp: , Rfl:    fenofibrate (TRICOR) 48 MG tablet, Take 48 mg by mouth every evening. Reported on 09/22/2015, Disp: , Rfl:    gabapentin (NEURONTIN) 600 MG tablet, Take 1 capsule by mouth. 2-3 x daily, Disp: , Rfl:    l-methylfolate-B6-B12 (METANX) 3-35-2 MG TABS tablet, Take 1 tablet by mouth daily., Disp: , Rfl:    losartan-hydrochlorothiazide (HYZAAR) 100-25 MG per tablet, Take 1 tablet by mouth daily., Disp: , Rfl:    metoprolol succinate (TOPROL-XL) 50 MG 24 hr tablet, Take 50 mg by mouth daily. Take with or immediately following a meal., Disp: , Rfl:    montelukast (SINGULAIR) 10 MG tablet, Take 10 mg by mouth at bedtime as needed. , Disp: , Rfl:    Multiple Vitamin (MULTIVITAMIN) capsule, Take 1 capsule by mouth daily., Disp: , Rfl:    traMADol (ULTRAM) 50 MG tablet, Take 1 tablet by mouth daily., Disp: , Rfl:    Cardiovascular & other pertient studies:  Reviewed external labs and tests, independently interpreted  EKG 11/27/2022: Sinus rhythm 53 bpm First degree A-V block  Nonspecific T wave abnormality  Echocardiogram 10/22/2022: Normal LV systolic function with EF 61%. Left ventricle cavity is normal in size.  Normal left ventricular wall thickness. Normal global wall motion. Normal diastolic filling pattern. Calculated EF 61%. Left atrial cavity is moderately dilated. Trileaflet aortic valve with no regurgitation. Mild aortic valve leaflet thickening. No evidence of aortic valve stenosis. Peak PG of 10 mm Hg. IVC is dilated with respiratory variation and may suggest elevated right heart pressure.  Recent labs: 10/14/2022: Glucose 98, BUN/Cr 26/1.34. EGFR 36. Na/K 135/3.9. Rest of the CMP normal H/H 12/39. MCV 90. Platelets 233   Review of Systems  Cardiovascular:  Negative for chest pain, dyspnea on exertion, leg swelling, palpitations and syncope.         Vitals:   11/27/22 1118  BP: (!) 145/74  Pulse: 63  SpO2: 94%    Body mass index is 28.52 kg/m. Filed Weights   11/27/22 1118  Weight: 161 lb (73 kg)     Objective:   Physical Exam Vitals and nursing note reviewed.  Constitutional:      General: She is not in acute distress. Neck:     Vascular: No JVD.  Cardiovascular:     Rate and Rhythm: Normal rate and regular rhythm.     Heart sounds: Murmur heard.     Harsh midsystolic murmur is present with a grade of 2/6 at the upper right sternal border radiating to the neck.  Pulmonary:     Effort: Pulmonary effort is normal.     Breath sounds: Normal breath sounds. No wheezing  or rales.  Musculoskeletal:     Right lower leg: No edema.     Left lower leg: No edema.           Visit diagnoses:   ICD-10-CM   1. Essential hypertension  I10 EKG 12-Lead    PCV RENAL/RENAL ARTERY DUPLEX COMPLETE       Orders Placed This Encounter  Procedures   EKG 12-Lead   PCV RENAL/RENAL ARTERY DUPLEX COMPLETE     Medication changes this visit: Medications Discontinued During This Encounter  Medication Reason   isosorbide-hydrALAZINE (BIDIL) 20-37.5 MG tablet Reorder     Assessment & Recommendations:   87 y.o. Caucasian female with hypertension  Hypertension: On  multiple medications. Resume Bidil. Check renal artery duplex.  F/u in 4 weeks       Nigel Mormon, MD Pager: [phone] Office: [phone]

## 2022-12-11 DIAGNOSIS — R7309 Other abnormal glucose: Secondary | ICD-10-CM | POA: Diagnosis not present

## 2022-12-11 DIAGNOSIS — N1832 Chronic kidney disease, stage 3b: Secondary | ICD-10-CM | POA: Diagnosis not present

## 2022-12-11 DIAGNOSIS — I129 Hypertensive chronic kidney disease with stage 1 through stage 4 chronic kidney disease, or unspecified chronic kidney disease: Secondary | ICD-10-CM | POA: Diagnosis not present

## 2022-12-11 DIAGNOSIS — E559 Vitamin D deficiency, unspecified: Secondary | ICD-10-CM | POA: Diagnosis not present

## 2022-12-18 DIAGNOSIS — Z79891 Long term (current) use of opiate analgesic: Secondary | ICD-10-CM | POA: Diagnosis not present

## 2022-12-18 DIAGNOSIS — G894 Chronic pain syndrome: Secondary | ICD-10-CM | POA: Diagnosis not present

## 2022-12-18 DIAGNOSIS — B0229 Other postherpetic nervous system involvement: Secondary | ICD-10-CM | POA: Diagnosis not present

## 2022-12-26 ENCOUNTER — Ambulatory Visit: Payer: Medicare Other

## 2022-12-31 ENCOUNTER — Ambulatory Visit: Payer: Medicare Other | Admitting: Cardiology

## 2023-01-26 ENCOUNTER — Ambulatory Visit: Payer: Medicare Other

## 2023-01-26 DIAGNOSIS — I10 Essential (primary) hypertension: Secondary | ICD-10-CM | POA: Diagnosis not present

## 2023-02-02 NOTE — Progress Notes (Unsigned)
Follow up visit  Subjective:   Faith Ritter, female    DOB: [birth year], 87 y.o.   MRN: [id number]   HPI  No chief complaint on file.   87 y.o. Caucasian female with hypertension  Blood pressure has slowly crept up since stopping Bidil ast last visit. Otherwise, patient is doing well. She walks 8,000 steps/day, also does boxing. She denies chest pain, shortness of breath, palpitations, leg edema, orthopnea, PND, TIA/syncope. Reviewed recent test results with the patient, details below.     Current Outpatient Medications:    amLODipine (NORVASC) 5 MG tablet, Take 5 mg by mouth in the morning and at bedtime., Disp: , Rfl:    Calcium-Phosphorus-Vitamin D (CITRACAL +D3 PO), Take 1 tablet by mouth 2 (two) times daily., Disp: , Rfl:    fenofibrate (TRICOR) 48 MG tablet, Take 48 mg by mouth every evening. Reported on 09/22/2015, Disp: , Rfl:    gabapentin (NEURONTIN) 600 MG tablet, Take 1 capsule by mouth. 2-3 x daily, Disp: , Rfl:    isosorbide-hydrALAZINE (BIDIL) 20-37.5 MG tablet, Take 1 tablet by mouth 3 (three) times daily., Disp: 270 tablet, Rfl: 3   l-methylfolate-B6-B12 (METANX) 3-35-2 MG TABS tablet, Take 1 tablet by mouth daily., Disp: , Rfl:    losartan-hydrochlorothiazide (HYZAAR) 100-25 MG per tablet, Take 1 tablet by mouth daily., Disp: , Rfl:    metoprolol succinate (TOPROL-XL) 50 MG 24 hr tablet, Take 50 mg by mouth daily. Take with or immediately following a meal., Disp: , Rfl:    montelukast (SINGULAIR) 10 MG tablet, Take 10 mg by mouth at bedtime as needed. , Disp: , Rfl:    Multiple Vitamin (MULTIVITAMIN) capsule, Take 1 capsule by mouth daily., Disp: , Rfl:    traMADol (ULTRAM) 50 MG tablet, Take 1 tablet by mouth daily., Disp: , Rfl:    Cardiovascular & other pertient studies:  Reviewed external labs and tests, independently interpreted  EKG 11/27/2022: Sinus rhythm 53 bpm First degree A-V block  Nonspecific T wave abnormality  Renal artery duplex   01/26/2023: No evidence of renal artery occlusive disease in either renal artery. Normal intrarenal vascular perfusion is noted in both kidneys. Renal length is normal in the bilateral kidneys. There are two simple cysts in each kidney measuring approximately 1.6x1.6 cm.   Echocardiogram 10/22/2022: Normal LV systolic function with EF 61%. Left ventricle cavity is normal in size. Normal left ventricular wall thickness. Normal global wall motion. Normal diastolic filling pattern. Calculated EF 61%. Left atrial cavity is moderately dilated. Trileaflet aortic valve with no regurgitation. Mild aortic valve leaflet thickening. No evidence of aortic valve stenosis. Peak PG of 10 mm Hg. IVC is dilated with respiratory variation and may suggest elevated right heart pressure.  Recent labs: 10/14/2022: Glucose 98, BUN/Cr 26/1.34. EGFR 36. Na/K 135/3.9. Rest of the CMP normal H/H 12/39. MCV 90. Platelets 233   Review of Systems  Cardiovascular:  Negative for chest pain, dyspnea on exertion, leg swelling, palpitations and syncope.         There were no vitals filed for this visit.   There is no height or weight on file to calculate BMI. There were no vitals filed for this visit.    Objective:   Physical Exam Vitals and nursing note reviewed.  Constitutional:      General: She is not in acute distress. Neck:     Vascular: No JVD.  Cardiovascular:     Rate and Rhythm: Normal rate and regular rhythm.  Heart sounds: Murmur heard.     Harsh midsystolic murmur is present with a grade of 2/6 at the upper right sternal border radiating to the neck.  Pulmonary:     Effort: Pulmonary effort is normal.     Breath sounds: Normal breath sounds. No wheezing or rales.  Musculoskeletal:     Right lower leg: No edema.     Left lower leg: No edema.           Visit diagnoses: No diagnosis found.    No orders of the defined types were placed in this encounter.    Medication changes  this visit: There are no discontinued medications.    Assessment & Recommendations:   87 y.o. Caucasian female with hypertension  Hypertension: On multiple medications. Resume Bidil. Check renal artery duplex.  F/u in 4 weeks       Elder Negus, MD Pager: [phone] Office: [phone]

## 2023-02-05 ENCOUNTER — Encounter: Payer: Self-pay | Admitting: Cardiology

## 2023-02-05 ENCOUNTER — Ambulatory Visit: Payer: Medicare Other | Admitting: Cardiology

## 2023-02-05 VITALS — BP 124/66 | HR 64 | Resp 16 | Ht 63.0 in | Wt 161.0 lb

## 2023-02-05 DIAGNOSIS — I10 Essential (primary) hypertension: Secondary | ICD-10-CM | POA: Diagnosis not present

## 2023-02-08 DIAGNOSIS — M2011 Hallux valgus (acquired), right foot: Secondary | ICD-10-CM | POA: Diagnosis not present

## 2023-02-12 DIAGNOSIS — Z79891 Long term (current) use of opiate analgesic: Secondary | ICD-10-CM | POA: Diagnosis not present

## 2023-02-12 DIAGNOSIS — G894 Chronic pain syndrome: Secondary | ICD-10-CM | POA: Diagnosis not present

## 2023-02-12 DIAGNOSIS — B0229 Other postherpetic nervous system involvement: Secondary | ICD-10-CM | POA: Diagnosis not present

## 2023-02-20 DIAGNOSIS — M21611 Bunion of right foot: Secondary | ICD-10-CM | POA: Diagnosis not present

## 2023-02-20 DIAGNOSIS — M79671 Pain in right foot: Secondary | ICD-10-CM | POA: Diagnosis not present

## 2023-03-04 DIAGNOSIS — D225 Melanocytic nevi of trunk: Secondary | ICD-10-CM | POA: Diagnosis not present

## 2023-03-04 DIAGNOSIS — L821 Other seborrheic keratosis: Secondary | ICD-10-CM | POA: Diagnosis not present

## 2023-03-04 DIAGNOSIS — Z08 Encounter for follow-up examination after completed treatment for malignant neoplasm: Secondary | ICD-10-CM | POA: Diagnosis not present

## 2023-03-04 DIAGNOSIS — Z85820 Personal history of malignant melanoma of skin: Secondary | ICD-10-CM | POA: Diagnosis not present

## 2023-03-04 DIAGNOSIS — D492 Neoplasm of unspecified behavior of bone, soft tissue, and skin: Secondary | ICD-10-CM | POA: Diagnosis not present

## 2023-03-04 DIAGNOSIS — L814 Other melanin hyperpigmentation: Secondary | ICD-10-CM | POA: Diagnosis not present

## 2023-03-13 DIAGNOSIS — M21611 Bunion of right foot: Secondary | ICD-10-CM | POA: Diagnosis not present

## 2023-03-13 DIAGNOSIS — M79671 Pain in right foot: Secondary | ICD-10-CM | POA: Diagnosis not present

## 2023-04-01 ENCOUNTER — Other Ambulatory Visit: Payer: Self-pay

## 2023-04-01 ENCOUNTER — Encounter (HOSPITAL_COMMUNITY): Payer: Self-pay

## 2023-04-01 ENCOUNTER — Emergency Department (HOSPITAL_COMMUNITY)
Admission: EM | Admit: 2023-04-01 | Discharge: 2023-04-01 | Disposition: A | Discharge status: Home / Self Care | Payer: Medicare Other | Attending: Emergency Medicine | Admitting: Emergency Medicine

## 2023-04-01 ENCOUNTER — Emergency Department (HOSPITAL_COMMUNITY): Payer: Medicare Other

## 2023-04-01 DIAGNOSIS — S52502A Unspecified fracture of the lower end of left radius, initial encounter for closed fracture: Secondary | ICD-10-CM | POA: Diagnosis not present

## 2023-04-01 DIAGNOSIS — S52572D Other intraarticular fracture of lower end of left radius, subsequent encounter for closed fracture with routine healing: Secondary | ICD-10-CM | POA: Diagnosis not present

## 2023-04-01 DIAGNOSIS — S52612A Displaced fracture of left ulna styloid process, initial encounter for closed fracture: Secondary | ICD-10-CM | POA: Diagnosis not present

## 2023-04-01 DIAGNOSIS — I10 Essential (primary) hypertension: Secondary | ICD-10-CM | POA: Diagnosis not present

## 2023-04-01 DIAGNOSIS — M25532 Pain in left wrist: Secondary | ICD-10-CM | POA: Diagnosis present

## 2023-04-01 DIAGNOSIS — W010XXA Fall on same level from slipping, tripping and stumbling without subsequent striking against object, initial encounter: Secondary | ICD-10-CM | POA: Insufficient documentation

## 2023-04-01 DIAGNOSIS — S52612K Displaced fracture of left ulna styloid process, subsequent encounter for closed fracture with nonunion: Secondary | ICD-10-CM | POA: Diagnosis not present

## 2023-04-01 DIAGNOSIS — R609 Edema, unspecified: Secondary | ICD-10-CM | POA: Diagnosis not present

## 2023-04-01 DIAGNOSIS — S52572A Other intraarticular fracture of lower end of left radius, initial encounter for closed fracture: Secondary | ICD-10-CM | POA: Diagnosis not present

## 2023-04-01 DIAGNOSIS — S6992XA Unspecified injury of left wrist, hand and finger(s), initial encounter: Secondary | ICD-10-CM | POA: Diagnosis not present

## 2023-04-01 DIAGNOSIS — S62102A Fracture of unspecified carpal bone, left wrist, initial encounter for closed fracture: Secondary | ICD-10-CM | POA: Insufficient documentation

## 2023-04-01 DIAGNOSIS — W19XXXA Unspecified fall, initial encounter: Secondary | ICD-10-CM | POA: Diagnosis not present

## 2023-04-01 DIAGNOSIS — Y9371 Activity, boxing: Secondary | ICD-10-CM | POA: Diagnosis not present

## 2023-04-01 DIAGNOSIS — M1812 Unilateral primary osteoarthritis of first carpometacarpal joint, left hand: Secondary | ICD-10-CM | POA: Diagnosis not present

## 2023-04-01 DIAGNOSIS — I959 Hypotension, unspecified: Secondary | ICD-10-CM | POA: Diagnosis not present

## 2023-04-01 MED ORDER — OXYCODONE-ACETAMINOPHEN 5-325 MG PO TABS
1.0000 | ORAL_TABLET | Freq: Once | ORAL | Status: AC
  Administered 2023-04-01: 1 via ORAL
  Filled 2023-04-01: qty 1

## 2023-04-01 MED ORDER — MORPHINE SULFATE (PF) 4 MG/ML IV SOLN
4.0000 mg | Freq: Once | INTRAVENOUS | Status: AC
  Administered 2023-04-01: 4 mg via INTRAVENOUS
  Filled 2023-04-01: qty 1

## 2023-04-01 MED ORDER — LIDOCAINE HCL (PF) 1 % IJ SOLN
30.0000 mL | Freq: Once | INTRAMUSCULAR | Status: AC
  Administered 2023-04-01: 30 mL
  Filled 2023-04-01: qty 30

## 2023-04-01 NOTE — ED Provider Notes (Addendum)
EMERGENCY DEPARTMENT AT [facility] Provider Note   CSN: [id number] Arrival date & time: 04/01/23  1438     History  Chief Complaint  Patient presents with   Faith Ritter    Faith Ritter is a 87 y.o. female.  Patient is an 87 year old female with a past medical history of hypertension presenting to the emergency department with left wrist pain after a fall.  The patient states that she was at a boxing class at her assisted living and was sliding side-to-side when she tripped and fell and reached out with her left arm to try to catch herself.  She states that she landed on her left hand and bottom.  She denies hitting her head or losing consciousness.  She denies any other pain or injuries from the fall.  She denies any blood thinner use.  The history is provided by the patient and a relative.  Fall       Home Medications Prior to Admission medications   Medication Sig Start Date End Date Taking? Authorizing Provider  amLODipine (NORVASC) 5 MG tablet Take 5 mg by mouth in the morning and at bedtime.    [provider]  Calcium-Phosphorus-Vitamin D (CITRACAL +D3 PO) Take 1 tablet by mouth 2 (two) times daily.    [provider]  fenofibrate (TRICOR) 48 MG tablet Take 48 mg by mouth every evening. Reported on 09/22/2015    [provider]  gabapentin (NEURONTIN) 600 MG tablet Take 1 capsule by mouth. 2-3 x daily 11/16/18   [provider]  isosorbide-hydrALAZINE (BIDIL) 20-37.5 MG tablet Take 1 tablet by mouth 3 (three) times daily. 11/27/22   Patwardhan, Anabel Bene, MD  l-methylfolate-B6-B12 (METANX) 3-35-2 MG TABS tablet Take 1 tablet by mouth daily.    [provider]  losartan-hydrochlorothiazide (HYZAAR) 100-25 MG per tablet Take 1 tablet by mouth daily.    [provider]  metoprolol succinate (TOPROL-XL) 50 MG 24 hr tablet Take 50 mg by mouth daily. Take with or immediately following a meal.    [provider]  montelukast (SINGULAIR) 10 MG tablet Take 10 mg by mouth at bedtime as needed.     [provider]  Multiple Vitamin (MULTIVITAMIN) capsule Take 1 capsule by mouth daily.    [provider]  traMADol (ULTRAM) 50 MG tablet Take 1 tablet by mouth daily. 01/03/19   [provider]      Allergies    Ciprofloxacin hcl, Penicillins, and Sulfa antibiotics    Review of Systems   Review of Systems  Physical Exam Updated Vital Signs BP 137/72   Pulse (!) 57   Temp 98 F (36.7 C) (Oral)   Resp 18   Ht 5\' 3"  (1.6 m)   Wt 70.3 kg   SpO2 96%   BMI 27.46 kg/m  Physical Exam Vitals and nursing note reviewed.  Constitutional:      General: She is not in acute distress.    Appearance: Normal appearance.  HENT:     Head: Normocephalic and atraumatic.     Nose: Nose normal.     Mouth/Throat:     Mouth: Mucous membranes are moist.     Pharynx: Oropharynx is clear.  Eyes:     Extraocular Movements: Extraocular movements intact.     Conjunctiva/sclera: Conjunctivae normal.  Neck:     Comments: No midline neck tenderness Cardiovascular:     Rate and Rhythm: Normal rate and regular rhythm.  Pulses: Normal pulses.     Heart sounds: Normal heart sounds.  Pulmonary:     Effort: Pulmonary effort is normal.     Breath sounds: Normal breath sounds.  Abdominal:     General: Abdomen is flat.     Palpations: Abdomen is soft.     Tenderness: There is no abdominal tenderness.  Musculoskeletal:     Cervical back: Normal range of motion and neck supple.     Comments: No midline back tenderness Hematoma to left ulnar side of forearm and diffuse wrist swelling and tenderness to palpation, no elbow bony tenderness to palpation No midline back tenderness Pelvis stable, nontender No bony tenderness to right upper extremity or bilateral lower extremities  Skin:    General: Skin is warm and dry.     Comments: Approximately 3 cm skin tear to left elbow,  nonbleeding  Neurological:     General: No focal deficit present.     Mental Status: She is alert and oriented to person, place, and time.     Sensory: No sensory deficit.     Motor: No weakness.  Psychiatric:        Mood and Affect: Mood normal.        Behavior: Behavior normal.     ED Results / Procedures / Treatments   Labs (all labs ordered are listed, but only abnormal results are displayed) Labs Reviewed - No data to display  EKG None  Radiology DG Wrist Complete Left  Result Date: 04/01/2023 CLINICAL DATA:  Post splint EXAM: LEFT WRIST - COMPLETE 3+ VIEW COMPARISON:  04/01/2023 FINDINGS: In splint views of the left wrist again demonstrate the comminuted intra-articular distal left radial fracture and ulnar styloid fracture. Improved alignment. Continued slight posterior angulation. 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 IMPRESSION: Post splinting. Improved alignment across the distal left radial fracture with continued slight posterior angulation. Ulnar styloid fracture. Electronically Signed   By: Charlett Nose M.D.   On: 04/01/2023 19:09   DG Forearm Left  Result Date: 04/01/2023 CLINICAL DATA:  Fall during boxing class EXAM: LEFT WRIST - COMPLETE 3+ VIEW; LEFT FOREARM - 2 VIEW COMPARISON:  None Available. FINDINGS: Acute comminuted intra-articular fracture of the distal left radius. There is slight dorsal displacement of the dominant distal fragment with slight apex palmar angulation. Soft tissue swelling. Chronic ununited ulnar styloid fracture. Advanced degenerative arthritis first CMC joint. IMPRESSION: Acute comminuted intra-articular fracture of the distal left radius. Electronically Signed   By: Minerva Fester M.D.   On: 04/01/2023 16:54   DG Wrist Complete Left  Result Date: 04/01/2023 CLINICAL DATA:  Fall during boxing class EXAM: LEFT WRIST - COMPLETE 3+ VIEW; LEFT FOREARM - 2 VIEW COMPARISON:  None Available. FINDINGS: Acute comminuted  intra-articular fracture of the distal left radius. There is slight dorsal displacement of the dominant distal fragment with slight apex palmar angulation. Soft tissue swelling. Chronic ununited ulnar styloid fracture. Advanced degenerative arthritis first CMC joint. IMPRESSION: Acute comminuted intra-articular fracture of the distal left radius. Electronically Signed   By: Minerva Fester M.D.   On: 04/01/2023 16:54    Procedures .Ortho Injury Treatment  Date/Time: 04/01/2023 7:47 PM  Performed by: Rexford Maus, DO Authorized by: Rexford Maus, DO   Consent:    Consent obtained:  Verbal   Consent given by:  Patient   Risks discussed:  Fracture, irreducible dislocation, restricted joint movement and stiffness   Alternatives discussed:  No treatmentInjury location: wrist Location details: left  wrist Injury type: fracture Fracture type: distal radius and ulnar styloid Pre-procedure neurovascular assessment: neurovascularly intact Pre-procedure distal perfusion: normal Pre-procedure neurological function: normal Pre-procedure range of motion: reduced Anesthesia: hematoma block  Anesthesia: Local anesthesia used: yes Local Anesthetic: lidocaine 1% without epinephrine Anesthetic total: 10 mL  Patient sedated: NoManipulation performed: no Immobilization: splint Splint type: sugar tong Splint Applied by: Ortho Tech Supplies used: Ortho-Glass Post-procedure neurovascular assessment: post-procedure neurovascularly intact Post-procedure distal perfusion: normal Post-procedure neurological function: normal Post-procedure range of motion: unchanged   .Marland KitchenLaceration Repair  Date/Time: 04/01/2023 7:56 PM  Performed by: Rexford Maus, DO Authorized by: Rexford Maus, DO   Consent:    Consent obtained:  Verbal   Consent given by:  Patient   Risks, benefits, and alternatives were discussed: yes     Risks discussed:  Infection, pain, need for additional repair  and poor cosmetic result   Alternatives discussed:  No treatment and delayed treatment Universal protocol:    Procedure explained and questions answered to patient or proxy's satisfaction: yes     Patient identity confirmed:  Verbally with patient Anesthesia:    Anesthesia method:  None Laceration details:    Location:  Shoulder/arm   Shoulder/arm location:  L elbow   Length (cm):  3   Depth (mm):  1 Pre-procedure details:    Preparation:  Imaging obtained to evaluate for foreign bodies Exploration:    Limited defect created (wound extended): yes     Hemostasis achieved with:  Direct pressure   Imaging obtained: x-ray     Imaging outcome: foreign body not noted     Wound exploration: wound explored through full range of motion and entire depth of wound visualized     Wound extent: areolar tissue not violated, fascia not violated, no foreign body, no signs of injury, no nerve damage, no tendon damage, no underlying fracture and no vascular damage     Contaminated: no   Treatment:    Area cleansed with:  Shur-Clens   Amount of cleaning:  Standard   Visualized foreign bodies/material removed: no     Debridement:  None   Undermining:  None   Scar revision: no   Skin repair:    Repair method:  Steri-Strips   Number of Steri-Strips:  3 Approximation:    Approximation:  Close Repair type:    Repair type:  Simple Post-procedure details:    Dressing:  Open (no dressing)   Procedure completion:  Tolerated well, no immediate complications     Medications Ordered in ED Medications  oxyCODONE-acetaminophen (PERCOCET/ROXICET) 5-325 MG per tablet 1 tablet (1 tablet Oral Given 04/01/23 1556)  lidocaine (PF) (XYLOCAINE) 1 % injection 30 mL (30 mLs Other Given 04/01/23 1742)  morphine (PF) 4 MG/ML injection 4 mg (4 mg Intravenous Given 04/01/23 1743)    ED Course/ Medical Decision Making/ A&P Clinical Course as of 04/01/23 1949  Wed Apr 01, 2023  1705 Intra articular fracture of L  wrist. Will attempt reduction and will require splinting. [VK]  1942 Improved alignment post splinting. She is stable for discharge with orthopedics follow up. [VK]    Clinical Course User Index [VK] Rexford Maus, DO                             Medical Decision Making This patient presents to the ED with chief complaint(s) of wrist pain, fall with pertinent past medical history of HTN which further complicates  the presenting complaint. The complaint involves an extensive differential diagnosis and also carries with it a high risk of complications and morbidity.    The differential diagnosis includes wrist fracture, forearm fracture, skin tear, no other traumatic injuries seen on exam, no presyncopal symptoms making syncopal fall unlikely  Additional history obtained: Additional history obtained from family Records reviewed Primary Care Documents  ED Course and Reassessment: Patient's arrival she is hemodynamically stable in no acute distress.  Remains neurovascularly intact but does have significant swelling and tenderness concerning for fracture with x-ray of her forearm and wrist.  She does have a small skin tear to her elbow that will be repaired with Steri-Strips.  An ice pack and Percocet for pain control and will be closely reassessed.  Independent labs interpretation:  N/A  Independent visualization of imaging: - I independently visualized the following imaging with scope of interpretation limited to determining acute life threatening conditions related to emergency care: L wrist/forearm XR, which revealed distal radius and ulnar styloid fracture  Consultation: - Consulted or discussed management/test interpretation w/ external professional: N/A  Consideration for admission or further workup: Patient has no emergent conditions requiring admission or further work-up at this time and is stable for discharge home with ortho follow-up  Social Determinants of health:  N/A    Amount and/or Complexity of Data Reviewed Radiology: ordered.  Risk Prescription drug management.          Final Clinical Impression(s) / ED Diagnoses Final diagnoses:  Closed fracture of left wrist, initial encounter    Rx / DC Orders ED Discharge Orders     None         Rexford Maus, DO 04/01/23 1949    Rexford Maus, DO 04/01/23 [phone]

## 2023-04-01 NOTE — Progress Notes (Signed)
Orthopedic Tech Progress Note Patient Details:  Faith Ritter 1936/04/22 [id number]  Ortho Devices Type of Ortho Device: Sling immobilizer, Sugartong splint, Ace wrap, Finger trap Finger Trap Weight: 10 Ortho Device/Splint Location: left sugartong left sling (left sling) Ortho Device/Splint Interventions: Ordered, Application, Adjustment   Post Interventions Patient Tolerated: Well Instructions Provided: Adjustment of device, Care of device  Kizzie Fantasia 04/01/2023, 6:46 PM

## 2023-04-01 NOTE — ED Triage Notes (Signed)
Patient brought in by EMS from [facility] for fall during boxing class. Patient states she was boxing at SNF turned left and fell causing pain and swelling to L wrist. She denies LOC and denies thinners. EMS started 20g in R AC and gave 400cc NS.  130/60 60 126: CBG

## 2023-04-01 NOTE — Discharge Instructions (Addendum)
You were seen in the emergency department after your fall.  Your x-ray did show that you have a wrist fracture and we placed it in a splint.  You should keep this on at all times until you see the orthopedic doctor.  You can continue to ice your wrist and keep it elevated and you can take Tylenol and Motrin as needed for pain and you can take your tramadol for breakthrough pain.  This can make you drowsy so do not take it before driving or working.  It can also make you constipated so you should take a stool softener while you are on it.  You should return to the emergency department if your fingers turn numb or blue or if you have any other new or concerning symptoms.

## 2023-04-06 DIAGNOSIS — M25531 Pain in right wrist: Secondary | ICD-10-CM | POA: Diagnosis not present

## 2023-04-07 DIAGNOSIS — M25531 Pain in right wrist: Secondary | ICD-10-CM | POA: Diagnosis not present

## 2023-04-08 ENCOUNTER — Telehealth: Payer: Self-pay | Admitting: *Deleted

## 2023-04-08 DIAGNOSIS — S52612D Displaced fracture of left ulna styloid process, subsequent encounter for closed fracture with routine healing: Secondary | ICD-10-CM | POA: Diagnosis not present

## 2023-04-08 DIAGNOSIS — S62102D Fracture of unspecified carpal bone, left wrist, subsequent encounter for fracture with routine healing: Secondary | ICD-10-CM | POA: Diagnosis not present

## 2023-04-08 DIAGNOSIS — R011 Cardiac murmur, unspecified: Secondary | ICD-10-CM | POA: Diagnosis not present

## 2023-04-08 DIAGNOSIS — M6281 Muscle weakness (generalized): Secondary | ICD-10-CM | POA: Diagnosis not present

## 2023-04-08 DIAGNOSIS — S52572D Other intraarticular fracture of lower end of left radius, subsequent encounter for closed fracture with routine healing: Secondary | ICD-10-CM | POA: Diagnosis not present

## 2023-04-08 DIAGNOSIS — R2681 Unsteadiness on feet: Secondary | ICD-10-CM | POA: Diagnosis not present

## 2023-04-08 DIAGNOSIS — I10 Essential (primary) hypertension: Secondary | ICD-10-CM | POA: Diagnosis not present

## 2023-04-08 DIAGNOSIS — R262 Difficulty in walking, not elsewhere classified: Secondary | ICD-10-CM | POA: Diagnosis not present

## 2023-04-08 DIAGNOSIS — I779 Disorder of arteries and arterioles, unspecified: Secondary | ICD-10-CM | POA: Diagnosis not present

## 2023-04-08 DIAGNOSIS — W19XXXD Unspecified fall, subsequent encounter: Secondary | ICD-10-CM | POA: Diagnosis not present

## 2023-04-08 NOTE — Telephone Encounter (Signed)
Transition Care Management Unsuccessful Follow-up Telephone Call  Date of discharge and from where:  [facility] 04/01/2023  Attempts:  2nd Attempt  Reason for unsuccessful TCM follow-up call:  Left voice message

## 2023-04-09 DIAGNOSIS — M6281 Muscle weakness (generalized): Secondary | ICD-10-CM | POA: Diagnosis not present

## 2023-04-09 DIAGNOSIS — R2681 Unsteadiness on feet: Secondary | ICD-10-CM | POA: Diagnosis not present

## 2023-04-09 DIAGNOSIS — W19XXXD Unspecified fall, subsequent encounter: Secondary | ICD-10-CM | POA: Diagnosis not present

## 2023-04-09 DIAGNOSIS — S62102D Fracture of unspecified carpal bone, left wrist, subsequent encounter for fracture with routine healing: Secondary | ICD-10-CM | POA: Diagnosis not present

## 2023-04-12 DIAGNOSIS — I10 Essential (primary) hypertension: Secondary | ICD-10-CM | POA: Diagnosis not present

## 2023-04-13 DIAGNOSIS — M6281 Muscle weakness (generalized): Secondary | ICD-10-CM | POA: Diagnosis not present

## 2023-04-13 DIAGNOSIS — R2681 Unsteadiness on feet: Secondary | ICD-10-CM | POA: Diagnosis not present

## 2023-04-13 DIAGNOSIS — W19XXXD Unspecified fall, subsequent encounter: Secondary | ICD-10-CM | POA: Diagnosis not present

## 2023-04-13 DIAGNOSIS — S62102D Fracture of unspecified carpal bone, left wrist, subsequent encounter for fracture with routine healing: Secondary | ICD-10-CM | POA: Diagnosis not present

## 2023-04-14 DIAGNOSIS — S52532A Colles' fracture of left radius, initial encounter for closed fracture: Secondary | ICD-10-CM | POA: Diagnosis not present

## 2023-04-15 DIAGNOSIS — R2681 Unsteadiness on feet: Secondary | ICD-10-CM | POA: Diagnosis not present

## 2023-04-15 DIAGNOSIS — M6281 Muscle weakness (generalized): Secondary | ICD-10-CM | POA: Diagnosis not present

## 2023-04-15 DIAGNOSIS — S62102D Fracture of unspecified carpal bone, left wrist, subsequent encounter for fracture with routine healing: Secondary | ICD-10-CM | POA: Diagnosis not present

## 2023-04-15 DIAGNOSIS — W19XXXD Unspecified fall, subsequent encounter: Secondary | ICD-10-CM | POA: Diagnosis not present

## 2023-04-23 DIAGNOSIS — R946 Abnormal results of thyroid function studies: Secondary | ICD-10-CM | POA: Diagnosis not present

## 2023-04-23 DIAGNOSIS — Z13220 Encounter for screening for lipoid disorders: Secondary | ICD-10-CM | POA: Diagnosis not present

## 2023-04-23 DIAGNOSIS — R7309 Other abnormal glucose: Secondary | ICD-10-CM | POA: Diagnosis not present

## 2023-04-23 DIAGNOSIS — Z79899 Other long term (current) drug therapy: Secondary | ICD-10-CM | POA: Diagnosis not present

## 2023-04-30 DIAGNOSIS — M810 Age-related osteoporosis without current pathological fracture: Secondary | ICD-10-CM | POA: Diagnosis not present

## 2023-04-30 DIAGNOSIS — N1832 Chronic kidney disease, stage 3b: Secondary | ICD-10-CM | POA: Diagnosis not present

## 2023-04-30 DIAGNOSIS — E559 Vitamin D deficiency, unspecified: Secondary | ICD-10-CM | POA: Diagnosis not present

## 2023-04-30 DIAGNOSIS — I129 Hypertensive chronic kidney disease with stage 1 through stage 4 chronic kidney disease, or unspecified chronic kidney disease: Secondary | ICD-10-CM | POA: Diagnosis not present

## 2023-04-30 DIAGNOSIS — I341 Nonrheumatic mitral (valve) prolapse: Secondary | ICD-10-CM | POA: Diagnosis not present

## 2023-04-30 DIAGNOSIS — Z79899 Other long term (current) drug therapy: Secondary | ICD-10-CM | POA: Diagnosis not present

## 2023-04-30 DIAGNOSIS — B0229 Other postherpetic nervous system involvement: Secondary | ICD-10-CM | POA: Diagnosis not present

## 2023-04-30 DIAGNOSIS — E7800 Pure hypercholesterolemia, unspecified: Secondary | ICD-10-CM | POA: Diagnosis not present

## 2023-04-30 DIAGNOSIS — M47812 Spondylosis without myelopathy or radiculopathy, cervical region: Secondary | ICD-10-CM | POA: Diagnosis not present

## 2023-04-30 DIAGNOSIS — I340 Nonrheumatic mitral (valve) insufficiency: Secondary | ICD-10-CM | POA: Diagnosis not present

## 2023-04-30 DIAGNOSIS — Z0000 Encounter for general adult medical examination without abnormal findings: Secondary | ICD-10-CM | POA: Diagnosis not present

## 2023-04-30 DIAGNOSIS — Z9109 Other allergy status, other than to drugs and biological substances: Secondary | ICD-10-CM | POA: Diagnosis not present

## 2023-05-14 DIAGNOSIS — S52532A Colles' fracture of left radius, initial encounter for closed fracture: Secondary | ICD-10-CM | POA: Diagnosis not present

## 2023-06-11 DIAGNOSIS — S52532D Colles' fracture of left radius, subsequent encounter for closed fracture with routine healing: Secondary | ICD-10-CM | POA: Diagnosis not present

## 2023-06-15 DIAGNOSIS — M2041 Other hammer toe(s) (acquired), right foot: Secondary | ICD-10-CM | POA: Diagnosis not present

## 2023-06-15 DIAGNOSIS — M21611 Bunion of right foot: Secondary | ICD-10-CM | POA: Diagnosis not present

## 2023-07-17 DIAGNOSIS — M47812 Spondylosis without myelopathy or radiculopathy, cervical region: Secondary | ICD-10-CM | POA: Diagnosis not present

## 2023-07-17 DIAGNOSIS — M7989 Other specified soft tissue disorders: Secondary | ICD-10-CM | POA: Diagnosis not present

## 2023-07-17 DIAGNOSIS — R29898 Other symptoms and signs involving the musculoskeletal system: Secondary | ICD-10-CM | POA: Diagnosis not present

## 2023-07-23 DIAGNOSIS — S52532D Colles' fracture of left radius, subsequent encounter for closed fracture with routine healing: Secondary | ICD-10-CM | POA: Diagnosis not present

## 2023-08-17 DIAGNOSIS — M21611 Bunion of right foot: Secondary | ICD-10-CM | POA: Diagnosis not present

## 2023-08-19 DIAGNOSIS — I129 Hypertensive chronic kidney disease with stage 1 through stage 4 chronic kidney disease, or unspecified chronic kidney disease: Secondary | ICD-10-CM | POA: Diagnosis not present

## 2023-08-19 DIAGNOSIS — K5900 Constipation, unspecified: Secondary | ICD-10-CM | POA: Diagnosis not present

## 2023-08-26 ENCOUNTER — Telehealth (HOSPITAL_BASED_OUTPATIENT_CLINIC_OR_DEPARTMENT_OTHER): Payer: Self-pay | Admitting: *Deleted

## 2023-08-26 NOTE — Telephone Encounter (Signed)
   Name: Faith Ritter  DOB: [birth year]  MRN: [id number]  Primary Cardiologist: None   Preoperative team, please contact this patient and set up a phone call appointment for further preoperative risk assessment. Please obtain consent and complete medication review. Thank you for your help.  I confirm that guidance regarding antiplatelet and oral anticoagulation therapy has been completed and, if necessary, noted below.   None Requested   I also confirmed the patient resides in the state of [place]. As per [facility] Medical Board telemedicine laws, the patient must reside in the state in which the provider is licensed.   Ronney Asters, NP 08/26/2023, 11:10 AM [facility]

## 2023-08-26 NOTE — Telephone Encounter (Signed)
   Pre-operative Risk Assessment    Patient Name: Faith Ritter  DOB: [birth year] MRN: [id number]   Last OV: Dr. Rosemary Holms 02/05/2023 Upcoming OV: None  Request for Surgical Clearance    Procedure:   Right first metatarsophalangeal joint arthrodesis, open treatment of 2nd and 3rd metatarsophalangeal joints, angular corrections the 2nd and 3rd toe via soft tissue, possible 2nd and 3rd metatarsal weil osteotomes.   Date of Surgery:  Clearance TBD                                 Surgeon:  Dr. Nicki Guadalajara  Surgeon's Group or Practice Name:  [facility]  Phone number:  [phone] Fax number:  [phone]   Type of Clearance Requested:   - Medical    Type of Anesthesia:  General    Additional requests/questions:    Signed, Emmit Pomfret   08/26/2023, 10:59 AM

## 2023-08-26 NOTE — Telephone Encounter (Signed)
Dr. Rosemary Holms primary cardiologist.   Left message to call back to schedule tele preop appt.

## 2023-08-27 ENCOUNTER — Telehealth: Payer: Self-pay

## 2023-08-27 NOTE — Telephone Encounter (Signed)
  Patient Consent for Virtual Visit         Faith Ritter has provided verbal consent on 08/27/2023 for a virtual visit (video or telephone).   CONSENT FOR VIRTUAL VISIT FOR:  Faith Ritter  By participating in this virtual visit I agree to the following:  I hereby voluntarily request, consent and authorize [facility] and its employed or contracted physicians, physician assistants, nurse practitioners or other licensed health care professionals (the Practitioner), to provide me with telemedicine health care services (the "Services") as deemed necessary by the treating Practitioner. I acknowledge and consent to receive the Services by the Practitioner via telemedicine. I understand that the telemedicine visit will involve communicating with the Practitioner through live audiovisual communication technology and the disclosure of certain medical information by electronic transmission. I acknowledge that I have been given the opportunity to request an in-person assessment or other available alternative prior to the telemedicine visit and am voluntarily participating in the telemedicine visit.  I understand that I have the right to withhold or withdraw my consent to the use of telemedicine in the course of my care at any time, without affecting my right to future care or treatment, and that the Practitioner or I may terminate the telemedicine visit at any time. I understand that I have the right to inspect all information obtained and/or recorded in the course of the telemedicine visit and may receive copies of available information for a reasonable fee.  I understand that some of the potential risks of receiving the Services via telemedicine include:  Delay or interruption in medical evaluation due to technological equipment failure or disruption; Information transmitted may not be sufficient (e.g. poor resolution of images) to allow for appropriate medical decision making by the  Practitioner; and/or  In rare instances, security protocols could fail, causing a breach of personal health information.  Furthermore, I acknowledge that it is my responsibility to provide information about my medical history, conditions and care that is complete and accurate to the best of my ability. I acknowledge that Practitioner's advice, recommendations, and/or decision may be based on factors not within their control, such as incomplete or inaccurate data provided by me or distortions of diagnostic images or specimens that may result from electronic transmissions. I understand that the practice of medicine is not an exact science and that Practitioner makes no warranties or guarantees regarding treatment outcomes. I acknowledge that a copy of this consent can be made available to me via my patient portal [facility] MyChart), or I can request a printed copy by calling the office of [facility].    I understand that my insurance will be billed for this visit.   I have read or had this consent read to me. I understand the contents of this consent, which adequately explains the benefits and risks of the Services being provided via telemedicine.  I have been provided ample opportunity to ask questions regarding this consent and the Services and have had my questions answered to my satisfaction. I give my informed consent for the services to be provided through the use of telemedicine in my medical care

## 2023-08-27 NOTE — Telephone Encounter (Signed)
Patient scheduled for tele visit on 09/11/23. Med rec and consent done

## 2023-08-27 NOTE — Telephone Encounter (Signed)
Pt returning call asking for a c/b after 1pm

## 2023-09-11 ENCOUNTER — Encounter: Payer: Self-pay | Admitting: Nurse Practitioner

## 2023-09-11 ENCOUNTER — Ambulatory Visit: Payer: Medicare Other | Attending: Nurse Practitioner | Admitting: Nurse Practitioner

## 2023-09-11 DIAGNOSIS — Z01810 Encounter for preprocedural cardiovascular examination: Secondary | ICD-10-CM

## 2023-09-11 NOTE — Progress Notes (Signed)
 Virtual Visit via Telephone Note   Because of Faith Ritter's co-morbid illnesses, she is at least at moderate risk for complications without adequate follow up.  This format is felt to be most appropriate for this patient at this time.  The patient did not have access to video technology/had technical difficulties with video requiring transitioning to audio format only (telephone).  All issues noted in this document were discussed and addressed.  No physical exam could be performed with this format.  Please refer to the patient's chart for her consent to telehealth for Faith Ritter.  Evaluation Performed:  Preoperative cardiovascular risk assessment _____________   Date:  09/11/2023   Patient ID:  Faith Ritter, DOB [birth year], MRN [id number] Patient Location:  Home Provider location:   Office  Primary Care Provider:  Gerome Brunet, DO Primary Cardiologist:  Faith JINNY Lawrence, MD  Chief Complaint / Patient Profile   88 y.o. y/o female with a h/o hypertension, nonspecific T wave abnormality on ECG 11/2022, mild aortic leaflet thickening with no evidence of stenosis, normal LVEF on echocardiogram 10/26/2022 who is pending right first metatarsophalangeal joint arthodesis, open tx 2nd and 3rd metatarsophalangeal joints, angular corrections 2nd and 3rd toe via soft tissue, possible 2nd and 3rd metatarsal weil ostomies with Dr. Elsa on date TBD and presents today for telephonic preoperative cardiovascular risk assessment.  History of Present Illness    Faith Ritter is a 88 y.o. female who presents via audio/video conferencing for a telehealth visit today.  Pt was last seen in cardiology clinic on 02/05/2023 by Dr. Lawrence.  At that time Faith Ritter was doing well.  The patient is now pending procedure as outlined above. Since her last visit, she  denies chest pain, shortness of breath, lower extremity edema, fatigue, palpitations, melena, hematuria, hemoptysis,  diaphoresis, weakness, presyncope, syncope, orthopnea, and PND. She remains very active with boxing and house work and does not have any concerning cardiac symptoms.   Past Medical History    Past Medical History:  Diagnosis Date   Arthritis    Heart murmur    Hypercholesteremia    Hypertension    MVP (mitral valve prolapse)    Pulmonary embolism during pregnancy with complication, antenatal    50 years ago    Past Surgical History:  Procedure Laterality Date   BREAST SURGERY     Benign L breast cyst   CATARACT EXTRACTION Bilateral    EYE SURGERY     FOOT SURGERY Right    FOOT SURGERY Left 10/2018   KNEE ARTHROPLASTY Right 12/04/2016   Procedure: RIGHT TOTAL KNEE ARTHROPLASTY WITH COMPUTER NAVIGATION;  Surgeon: Redell Shoals, MD;  Location: [facility];  Service: Orthopedics;  Laterality: Right;  Adductor Block    Allergies  Allergies  Allergen Reactions   Ciprofloxacin Hcl Other (See Comments)    makes hands and feet cold   Penicillins Hives, Swelling and Rash    Has patient had a PCN reaction causing immediate rash, facial/tongue/throat swelling, SOB or lightheadedness with hypotension: Yes Has patient had a PCN reaction causing severe rash involving mucus membranes or skin necrosis: unknown Has patient had a PCN reaction that required hospitalization No Has patient had a PCN reaction occurring within the last 10 years: No If all of the above answers are NO, then may proceed with Cephalosporin use.    Sulfa Antibiotics Hives    Home Medications    Prior to Admission medications   Medication Sig Start Date End  Date Taking? Authorizing Provider  amLODipine  (NORVASC ) 5 MG tablet Take 5 mg by mouth in the morning and at bedtime.    [provider]  Calcium -Phosphorus -Vitamin D  (CITRACAL +D3 PO) Take 1 tablet by mouth 2 (two) times daily.    [provider]  fenofibrate  (TRICOR ) 48 MG tablet Take 48 mg by mouth every evening. Reported on 09/22/2015     [provider]  gabapentin (NEURONTIN) 600 MG tablet Take 1 capsule by mouth. 2-3 x daily 11/16/18   [provider]  isosorbide -hydrALAZINE  (BIDIL ) 20-37.5 MG tablet Take 1 tablet by mouth 3 (three) times daily. 11/27/22   Patwardhan, Faith PARAS, MD  l-methylfolate-B6-B12 (METANX) 3-35-2 MG TABS tablet Take 1 tablet by mouth daily.    [provider]  losartan -hydrochlorothiazide  (HYZAAR) 100-25 MG per tablet Take 1 tablet by mouth daily.    [provider]  metoprolol  succinate (TOPROL -XL) 50 MG 24 hr tablet Take 50 mg by mouth daily. Take with or immediately following a meal.    [provider]  montelukast  (SINGULAIR ) 10 MG tablet Take 10 mg by mouth at bedtime as needed.     [provider]  Multiple Vitamin (MULTIVITAMIN) capsule Take 1 capsule by mouth daily.    [provider]  traMADol (ULTRAM) 50 MG tablet Take 1 tablet by mouth daily. 01/03/19   [provider]    Physical Exam    Vital Signs:  Faith Ritter does not have vital signs available for review today.  Given telephonic nature of communication, physical exam is limited. AAOx3. NAD. Normal affect.  Speech and respirations are unlabored.  Accessory Clinical Findings    None  Assessment & Plan    1.  Preoperative Cardiovascular Risk Assessment: According to the Revised Cardiac Risk Index (RCRI), her Perioperative Risk of Major Cardiac Event is (%): 0.4. Her Functional Capacity in METs is: 8.7 according to the Duke Activity Status Index (DASI). The patient is doing well from a cardiac perspective. Therefore, based on ACC/AHA guidelines, the patient would be at acceptable risk for the planned procedure without further cardiovascular testing.   The patient was advised that if she develops new symptoms prior to surgery to contact our office to arrange for a follow-up visit, and she verbalized understanding.  No request to hold cardiac medications.  A  copy of this note will be routed to requesting surgeon.  Time:   Today, I have spent 10 minutes with the patient with telehealth technology discussing medical history, symptoms, and management plan.     Faith EMERSON Bane, NP-C  09/11/2023, 1:41 PM [address], [address] Office [phone] Fax [phone]

## 2023-10-29 ENCOUNTER — Other Ambulatory Visit (HOSPITAL_COMMUNITY): Payer: Self-pay

## 2023-10-29 ENCOUNTER — Telehealth: Payer: Self-pay | Admitting: Pharmacist

## 2023-10-29 NOTE — Telephone Encounter (Signed)
 Bidil needs PA,if denied we can give her the tablets seperate

## 2023-10-29 NOTE — Telephone Encounter (Signed)
 Pharmacy Patient Advocate Encounter   Received notification from Pt Calls Messages that prior authorization for BIDIL is required/requested.   Insurance verification completed.   The patient is insured through [facility] .   Per test claim: Refill too soon. PA is not needed at this time. Medication was filled 10/12/23. Next eligible fill date is 11/10/23.

## 2023-11-16 ENCOUNTER — Telehealth: Payer: Self-pay | Admitting: Pharmacy Technician

## 2023-11-16 ENCOUNTER — Other Ambulatory Visit (HOSPITAL_COMMUNITY): Payer: Self-pay

## 2023-11-16 NOTE — Telephone Encounter (Signed)
   Ran test claim for BIDIL. For a 30 day supply and the co-pay is 83.23 . PA is not needed at this time. Nothing saying this is a transition fill.  I CALLED [facility] AND ASKED THEM TO FILL MED.

## 2023-12-13 ENCOUNTER — Other Ambulatory Visit: Payer: Self-pay | Admitting: Cardiology

## 2024-02-05 ENCOUNTER — Ambulatory Visit: Payer: Self-pay | Admitting: Cardiology
